# Patient Record
Sex: Female | Born: 1955
Health system: Southern US, Community
[De-identification: ages and names within clinical notes are randomized; demographics above are authoritative.]

## PROBLEM LIST (undated history)

## (undated) DIAGNOSIS — N943 Premenstrual tension syndrome: Secondary | ICD-10-CM

## (undated) HISTORY — DX: Premenstrual tension syndrome: N94.3

## (undated) HISTORY — PX: APPENDECTOMY: SHX54

---

## 2000-03-26 ENCOUNTER — Other Ambulatory Visit: Admission: RE | Admit: 2000-03-26 | Discharge: 2000-03-26 | Payer: Self-pay | Admitting: Family Medicine

## 2001-07-30 ENCOUNTER — Encounter: Payer: Self-pay | Admitting: Family Medicine

## 2001-07-30 ENCOUNTER — Encounter: Admission: RE | Admit: 2001-07-30 | Discharge: 2001-07-30 | Payer: Self-pay | Admitting: Family Medicine

## 2001-08-15 ENCOUNTER — Other Ambulatory Visit: Admission: RE | Admit: 2001-08-15 | Discharge: 2001-08-15 | Payer: Self-pay | Admitting: Family Medicine

## 2002-09-22 ENCOUNTER — Other Ambulatory Visit: Admission: RE | Admit: 2002-09-22 | Discharge: 2002-09-22 | Payer: Self-pay | Admitting: Family Medicine

## 2003-09-24 ENCOUNTER — Ambulatory Visit (HOSPITAL_COMMUNITY): Admission: RE | Admit: 2003-09-24 | Discharge: 2003-09-24 | Payer: Self-pay | Admitting: Family Medicine

## 2003-09-29 ENCOUNTER — Other Ambulatory Visit: Admission: RE | Admit: 2003-09-29 | Discharge: 2003-09-29 | Payer: Self-pay | Admitting: Family Medicine

## 2004-06-13 ENCOUNTER — Ambulatory Visit: Payer: Self-pay | Admitting: Family Medicine

## 2004-06-16 ENCOUNTER — Ambulatory Visit: Payer: Self-pay | Admitting: Family Medicine

## 2004-07-01 ENCOUNTER — Ambulatory Visit: Payer: Self-pay | Admitting: Family Medicine

## 2004-09-01 ENCOUNTER — Ambulatory Visit: Payer: Self-pay | Admitting: Family Medicine

## 2004-09-08 ENCOUNTER — Other Ambulatory Visit: Admission: RE | Admit: 2004-09-08 | Discharge: 2004-09-08 | Payer: Self-pay | Admitting: Family Medicine

## 2004-09-08 ENCOUNTER — Ambulatory Visit: Payer: Self-pay | Admitting: Family Medicine

## 2005-09-16 ENCOUNTER — Emergency Department (HOSPITAL_COMMUNITY): Admission: EM | Admit: 2005-09-16 | Discharge: 2005-09-16 | Payer: Self-pay | Admitting: Emergency Medicine

## 2005-09-18 ENCOUNTER — Ambulatory Visit: Payer: Self-pay | Admitting: Family Medicine

## 2005-11-28 ENCOUNTER — Ambulatory Visit (HOSPITAL_COMMUNITY): Admission: RE | Admit: 2005-11-28 | Discharge: 2005-11-28 | Payer: Self-pay | Admitting: Family Medicine

## 2005-12-07 ENCOUNTER — Ambulatory Visit: Payer: Self-pay | Admitting: Family Medicine

## 2005-12-14 ENCOUNTER — Ambulatory Visit: Payer: Self-pay | Admitting: Family Medicine

## 2005-12-14 ENCOUNTER — Other Ambulatory Visit: Admission: RE | Admit: 2005-12-14 | Discharge: 2005-12-14 | Payer: Self-pay | Admitting: Family Medicine

## 2005-12-14 ENCOUNTER — Encounter: Payer: Self-pay | Admitting: Family Medicine

## 2006-01-18 DIAGNOSIS — M25529 Pain in unspecified elbow: Secondary | ICD-10-CM | POA: Insufficient documentation

## 2007-01-24 ENCOUNTER — Ambulatory Visit (HOSPITAL_COMMUNITY): Admission: RE | Admit: 2007-01-24 | Discharge: 2007-01-24 | Payer: Self-pay | Admitting: Family Medicine

## 2007-02-14 ENCOUNTER — Ambulatory Visit: Payer: Self-pay | Admitting: Family Medicine

## 2007-03-15 ENCOUNTER — Ambulatory Visit: Payer: Self-pay | Admitting: Family Medicine

## 2007-03-15 LAB — CONVERTED CEMR LAB
ALT: 17 units/L (ref 0–35)
Alkaline Phosphatase: 69 units/L (ref 39–117)
BUN: 7 mg/dL (ref 6–23)
Basophils Relative: 0 % (ref 0.0–1.0)
CO2: 25 meq/L (ref 19–32)
Calcium: 8.8 mg/dL (ref 8.4–10.5)
Chloride: 109 meq/L (ref 96–112)
Creatinine, Ser: 0.8 mg/dL (ref 0.4–1.2)
HDL: 38.8 mg/dL — ABNORMAL LOW (ref >39.0)
Ketones, urine, test strip: NEGATIVE
LDL Cholesterol: 103 mg/dL — ABNORMAL HIGH (ref 0–99)
Monocytes Relative: 7.3 % (ref 3.0–11.0)
Nitrite: NEGATIVE
Platelets: 203 10*3/uL (ref 150–400)
RDW: 13.3 % (ref 11.5–14.6)
Total Bilirubin: 0.6 mg/dL (ref 0.3–1.2)
Total Protein: 6.4 g/dL (ref 6.0–8.3)
Triglycerides: 90 mg/dL (ref 0–149)
Urobilinogen, UA: 0.2
VLDL: 18 mg/dL (ref 0–40)

## 2007-03-22 ENCOUNTER — Other Ambulatory Visit: Admission: RE | Admit: 2007-03-22 | Discharge: 2007-03-22 | Payer: Self-pay | Admitting: Family Medicine

## 2007-03-22 ENCOUNTER — Encounter: Payer: Self-pay | Admitting: Family Medicine

## 2007-03-22 ENCOUNTER — Ambulatory Visit: Payer: Self-pay | Admitting: Family Medicine

## 2007-03-22 DIAGNOSIS — N959 Unspecified menopausal and perimenopausal disorder: Secondary | ICD-10-CM | POA: Insufficient documentation

## 2007-04-11 ENCOUNTER — Telehealth (INDEPENDENT_AMBULATORY_CARE_PROVIDER_SITE_OTHER): Payer: Self-pay | Admitting: *Deleted

## 2008-04-15 ENCOUNTER — Ambulatory Visit (HOSPITAL_COMMUNITY): Admission: RE | Admit: 2008-04-15 | Discharge: 2008-04-15 | Payer: Self-pay | Admitting: Family Medicine

## 2008-05-21 ENCOUNTER — Ambulatory Visit: Payer: Self-pay | Admitting: Family Medicine

## 2008-05-21 LAB — CONVERTED CEMR LAB
ALT: 20 units/L (ref 0–35)
AST: 20 units/L (ref 0–37)
Alkaline Phosphatase: 81 units/L (ref 39–117)
Basophils Absolute: 0 10*3/uL (ref 0.0–0.1)
Basophils Relative: 0.5 % (ref 0.0–3.0)
Bilirubin Urine: NEGATIVE
Bilirubin, Direct: 0.1 mg/dL (ref 0.0–0.3)
CO2: 28 meq/L (ref 19–32)
Chloride: 108 meq/L (ref 96–112)
Cholesterol: 188 mg/dL (ref 0–200)
Creatinine, Ser: 0.9 mg/dL (ref 0.4–1.2)
LDL Cholesterol: 124 mg/dL — ABNORMAL HIGH (ref 0–99)
Lymphocytes Relative: 40.8 % (ref 12.0–46.0)
MCHC: 33.6 g/dL (ref 30.0–36.0)
Neutrophils Relative %: 46.6 % (ref 43.0–77.0)
Platelets: 203 10*3/uL (ref 150–400)
Potassium: 4.5 meq/L (ref 3.5–5.1)
Protein, U semiquant: NEGATIVE
RBC: 4.84 M/uL (ref 3.87–5.11)
Sodium: 145 meq/L (ref 135–145)
Total Bilirubin: 0.9 mg/dL (ref 0.3–1.2)
Urobilinogen, UA: 0.2
VLDL: 21 mg/dL (ref 0–40)
WBC Urine, dipstick: NEGATIVE

## 2008-06-19 ENCOUNTER — Other Ambulatory Visit: Admission: RE | Admit: 2008-06-19 | Discharge: 2008-06-19 | Payer: Self-pay | Admitting: Family Medicine

## 2008-06-19 ENCOUNTER — Encounter: Payer: Self-pay | Admitting: Family Medicine

## 2008-06-19 ENCOUNTER — Ambulatory Visit: Payer: Self-pay | Admitting: Family Medicine

## 2009-05-13 ENCOUNTER — Ambulatory Visit (HOSPITAL_COMMUNITY): Admission: RE | Admit: 2009-05-13 | Discharge: 2009-05-13 | Payer: Self-pay | Admitting: Family Medicine

## 2009-05-13 LAB — HM MAMMOGRAPHY

## 2009-10-05 ENCOUNTER — Ambulatory Visit: Payer: Self-pay | Admitting: Family Medicine

## 2009-10-05 LAB — CONVERTED CEMR LAB
ALT: 15 units/L (ref 0–35)
AST: 16 units/L (ref 0–37)
Alkaline Phosphatase: 78 units/L (ref 39–117)
Bilirubin Urine: NEGATIVE
Calcium: 9.4 mg/dL (ref 8.4–10.5)
Eosinophils Relative: 3.7 % (ref 0.0–5.0)
GFR calc non Af Amer: 60.95 mL/min (ref >60)
HCT: 44.3 % (ref 36.0–46.0)
HDL: 48.8 mg/dL (ref >39.00)
Hemoglobin: 14.3 g/dL (ref 12.0–15.0)
Ketones, urine, test strip: NEGATIVE
LDL Cholesterol: 112 mg/dL — ABNORMAL HIGH (ref 0–99)
Lymphocytes Relative: 45 % (ref 12.0–46.0)
Lymphs Abs: 1.8 10*3/uL (ref 0.7–4.0)
Monocytes Relative: 9.1 % (ref 3.0–12.0)
Neutro Abs: 1.7 10*3/uL (ref 1.4–7.7)
Nitrite: NEGATIVE
Platelets: 200 10*3/uL (ref 150.0–400.0)
Potassium: 5 meq/L (ref 3.5–5.1)
Protein, U semiquant: NEGATIVE
Sodium: 144 meq/L (ref 135–145)
TSH: 2.02 microintl units/mL (ref 0.35–5.50)
Total Bilirubin: 0.7 mg/dL (ref 0.3–1.2)
Urobilinogen, UA: 0.2
VLDL: 27.8 mg/dL (ref 0.0–40.0)
WBC: 4.1 10*3/uL — ABNORMAL LOW (ref 4.5–10.5)

## 2009-10-14 ENCOUNTER — Other Ambulatory Visit: Admission: RE | Admit: 2009-10-14 | Discharge: 2009-10-14 | Payer: Self-pay | Admitting: Family Medicine

## 2009-10-14 ENCOUNTER — Ambulatory Visit: Payer: Self-pay | Admitting: Family Medicine

## 2010-01-28 ENCOUNTER — Encounter: Payer: Self-pay | Admitting: Family Medicine

## 2010-05-13 ENCOUNTER — Telehealth: Payer: Self-pay | Admitting: Family Medicine

## 2010-08-16 NOTE — Progress Notes (Signed)
Summary: REQUEST  Phone Note Call from Patient   Caller: Patient Summary of Call: Pt called in to see if Dr Tawanna Cooler would agree to accept her mother as a new pt  Sundra Aland, DOB: 6.15.1927..... Insurance: Medicare).... Pt can be reached at [phone] to advise.  Initial call taken by: Debbra Riding,  May 13, 2010 11:54 AM  Follow-up for Phone Call        yes Follow-up by: Roderick Pee MD,  May 13, 2010 2:02 PM  Additional Follow-up for Phone Call Additional follow up Details #1::        Contacted pt and adv her that Dr Tawanna Cooler will accept her mom as a new medicare pt.... Scheduled new pt appt on  07/21/2010  @  9:30am.  Additional Follow-up by: Debbra Riding,  May 13, 2010 2:52 PM

## 2010-08-16 NOTE — Letter (Signed)
Summary: [facility] Referral (unable to schedule) Notification  [facility]  [address] [place], [place] [address]   Phone: [phone]  Fax: [phone]      January 28, 2010 Tracy Burns May 30, 1956 MRN: [id number]   [facility] [address] [place], [place]  [address]   Dear Dr.TODD:   Thank you for your kind referral of the above patient. We have attempted to schedule the recommended COLONOSCOPY but have been unable to schedule because:  _X_ The patient was not available by phone and/or has not returned our calls.  __ The patient declined to schedule the procedure at this time.  We appreciate the referral and hope that we will have the opportunity to treat this patient in the future.    Sincerely,   [facility]  Vania Rea. Jarold Motto M.D. Hedwig Morton. Juanda Chance M.D. Venita Lick. Russella Dar M.D. Wilhemina Bonito. Marina Goodell M.D. Barbette Hair. Arlyce Dice M.D. Iva Boop M.D. Cheron Every.D.

## 2010-08-16 NOTE — Assessment & Plan Note (Signed)
Summary: cpx--pap//ccm   Vital Signs:  Patient profile:   55 year old female Menstrual status:  hysterectomy Height:      65.5 inches Weight:      183 pounds BMI:     30.10 Temp:     98.0 degrees F oral BP sitting:   118 / 78  (left arm) Cuff size:   regular  Vitals Entered By: Kern Reap CMA Duncan Dull) (October 14, 2009 3:59 PM) CC: cpx Is Patient Diabetic? No Pain Assessment Patient in pain? no          Menstrual Status hysterectomy Last PAP Result NEGATIVE FOR INTRAEPITHELIAL LESIONS OR MALIGNANCY.   CC:  cpx.  History of Present Illness: Tracy Burns is a 55 year old single female, 5 years, plus postmenopausal, who comes in today for physical examination  She's always been in excellent, health.  She's had no chronic health problems.  She takes 181 mg baby aspirin daily and uses Premarin vaginal cream twice weekly for vaginal dryness.  She gets routine eye care.  Dental care does BSE monthly and gets any mammography.  Tetanus 2003 seasonal flu 2010.  She's never had a colonoscopy.  We have recommended in the past, but she is not followed through.  This year.  She agrees to have a colonoscopy  Allergies: No Known Drug Allergies  Past History:  Past medical, surgical, family and social histories (including risk factors) reviewed, and no changes noted (except as noted below).  Past Medical History: Reviewed history from 03/19/2007 and no changes required. PMS  Past Surgical History: Reviewed history from 03/19/2007 and no changes required. Appendectomy  Family History: Reviewed history from 03/19/2007 and no changes required. Family History Hypertension Family History of Cardiovascular disorder  Social History: Reviewed history from 06/19/2008 and no changes required. 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 No apparent alopecia or balding. Eyes:  No corneal or conjunctival inflammation noted. EOMI. Perrla. Funduscopic exam benign, without hemorrhages, exudates or papilledema. Vision grossly normal. Ears:  External ear exam shows no significant lesions or deformities.  Otoscopic examination reveals clear canals, tympanic membranes are intact bilaterally without bulging, retraction, inflammation or discharge. Hearing is grossly normal bilaterally. Nose:  External nasal examination shows no deformity or inflammation. Nasal mucosa are pink and moist without lesions or exudates. Mouth:  Oral mucosa and oropharynx without lesions or exudates.  Teeth in good repair. Neck:  No deformities, masses, or tenderness noted. Chest Wall:  No deformities, masses, or tenderness noted. Breasts:  No mass, nodules, thickening, tenderness, bulging, retraction, inflamation, nipple discharge or skin changes noted.   Lungs:  Normal respiratory effort, chest expands symmetrically. Lungs are clear to auscultation, no crackles or wheezes. Heart:  Normal rate and regular rhythm. S1 and S2 normal without gallop, murmur, click, rub or other extra sounds. Abdomen:  Bowel sounds positive,abdomen soft and non-tender without masses, organomegaly or hernias noted. Rectal:  No external abnormalities noted. Normal sphincter tone. No rectal masses or tenderness. Genitalia:  Pelvic Exam:        External: normal female genitalia without lesions or masses        Vagina: normal without lesions or masses        Cervix: normal without lesions or masses  Adnexa: normal bimanual exam without masses or fullness        Uterus: normal by palpation        Pap smear: performed Msk:  No deformity or scoliosis noted of thoracic or lumbar spine.   Pulses:  R and L  carotid,radial,femoral,dorsalis pedis and posterior tibial pulses are full and equal bilaterally Extremities:  No clubbing, cyanosis, edema, or deformity noted with normal full range of motion of all joints.   Neurologic:  No cranial nerve deficits noted. Station and gait are normal. Plantar reflexes are down-going bilaterally. DTRs are symmetrical throughout. Sensory, motor and coordinative functions appear intact. Skin:  Intact without suspicious lesions or rashes Cervical Nodes:  No lymphadenopathy noted Axillary Nodes:  No palpable lymphadenopathy Inguinal Nodes:  No significant adenopathy Psych:  Cognition and judgment appear intact. Alert and cooperative with normal attention span and concentration. No apparent delusions, illusions, hallucinations   Impression & Recommendations:  Problem # 1:  DISORDER, MENOPAUSAL NOS (ICD-627.9) Assessment Improved  Her updated medication list for this problem includes:    Premarin 0.625 Mg/gm Crea (Estrogens, conjugated) .Marland Kitchen... Apply 2 x week  Orders: Prescription Created Electronically [phone])  Problem # 2:  HEALTH SCREENING (ICD-V70.0) Assessment: Unchanged  Orders: Prescription Created Electronically [id number]) EKG w/ Interpretation (93000)  Complete Medication List: 1)  Adult Aspirin Low Strength 81 Mg Tbdp (Aspirin) .... Once daily 2)  Premarin 0.625 Mg/gm Crea (Estrogens, conjugated) .... Apply 2 x week  Other Orders: Gastroenterology Referral (GI)  Patient Instructions: 1)  Please schedule a follow-up appointment in 1 year. 2)  It is important that you exercise regularly at least 20 minutes 5 times a week. If you develop chest pain, have severe difficulty breathing, or feel very tired , stop exercising immediately and seek medical attention. 3)  Schedule your mammogram. 4)  Schedule a colonoscopy/sigmoidoscopy to help detect colon cancer. 5)  Take calcium +Vitamin D daily. 6)  Take an Aspirin every day. Prescriptions: PREMARIN  0.625 MG/GM CREA (ESTROGENS, CONJUGATED) apply 2 x week  #3 tubes x 6   Entered and Authorized by:   Roderick Pee MD   Signed by:   Roderick Pee MD on 10/14/2009   Method used:   Electronically to        [facility]  [phone]* (retail)       [address] [place], [place]  [address]       Ph: [phone] or [phone]       Fax: [phone]   RxID:   [id number]    Immunization History:  Influenza Immunization History:    Influenza:  historical (04/16/2009)

## 2010-08-19 ENCOUNTER — Other Ambulatory Visit: Payer: Self-pay | Admitting: *Deleted

## 2010-08-19 DIAGNOSIS — N951 Menopausal and female climacteric states: Secondary | ICD-10-CM

## 2010-08-19 MED ORDER — ESTROGENS, CONJUGATED 0.625 MG/GM VA CREA: TOPICAL_CREAM | Freq: Every day | VAGINAL | Status: DC

## 2010-10-10 ENCOUNTER — Other Ambulatory Visit: Payer: Self-pay | Admitting: Family Medicine

## 2010-10-10 ENCOUNTER — Other Ambulatory Visit (INDEPENDENT_AMBULATORY_CARE_PROVIDER_SITE_OTHER): Payer: BC Managed Care – PPO | Admitting: Family Medicine

## 2010-10-10 DIAGNOSIS — E785 Hyperlipidemia, unspecified: Secondary | ICD-10-CM

## 2010-10-10 DIAGNOSIS — Z1231 Encounter for screening mammogram for malignant neoplasm of breast: Secondary | ICD-10-CM

## 2010-10-10 DIAGNOSIS — Z0000 Encounter for general adult medical examination without abnormal findings: Secondary | ICD-10-CM

## 2010-10-10 LAB — CBC WITH DIFFERENTIAL/PLATELET
Basophils Absolute: 0 10*3/uL (ref 0.0–0.1)
HCT: 44.9 % (ref 36.0–46.0)
Hemoglobin: 15.2 g/dL — ABNORMAL HIGH (ref 12.0–15.0)
Lymphs Abs: 2.2 10*3/uL (ref 0.7–4.0)
MCHC: 33.8 g/dL (ref 30.0–36.0)
MCV: 87.7 fl (ref 78.0–100.0)
Monocytes Absolute: 0.4 10*3/uL (ref 0.1–1.0)
Monocytes Relative: 8.8 % (ref 3.0–12.0)
Neutro Abs: 2.2 10*3/uL (ref 1.4–7.7)
Platelets: 219 10*3/uL (ref 150.0–400.0)
RDW: 14.5 % (ref 11.5–14.6)

## 2010-10-10 LAB — LIPID PANEL
Cholesterol: 218 mg/dL — ABNORMAL HIGH (ref 0–200)
Total CHOL/HDL Ratio: 4
Triglycerides: 118 mg/dL (ref 0.0–149.0)
VLDL: 23.6 mg/dL (ref 0.0–40.0)

## 2010-10-10 LAB — HEPATIC FUNCTION PANEL
AST: 18 U/L (ref 0–37)
Albumin: 4.3 g/dL (ref 3.5–5.2)
Total Bilirubin: 0.4 mg/dL (ref 0.3–1.2)

## 2010-10-10 LAB — BASIC METABOLIC PANEL
BUN: 14 mg/dL (ref 6–23)
CO2: 26 mEq/L (ref 19–32)
Chloride: 106 mEq/L (ref 96–112)
GFR: 60.73 mL/min (ref >60.00)
Glucose, Bld: 98 mg/dL (ref 70–99)
Potassium: 5.4 mEq/L — ABNORMAL HIGH (ref 3.5–5.1)
Sodium: 140 mEq/L (ref 135–145)

## 2010-10-10 LAB — POCT URINALYSIS DIPSTICK
Bilirubin, UA: NEGATIVE
Blood, UA: NEGATIVE
Glucose, UA: NEGATIVE
Spec Grav, UA: 1.01
Urobilinogen, UA: 0.2
pH, UA: 5

## 2010-10-10 LAB — LDL CHOLESTEROL, DIRECT: Direct LDL: 155 mg/dL

## 2010-10-14 ENCOUNTER — Encounter: Payer: Self-pay | Admitting: Family Medicine

## 2010-10-14 ENCOUNTER — Ambulatory Visit (HOSPITAL_COMMUNITY)
Admission: RE | Admit: 2010-10-14 | Discharge: 2010-10-14 | Disposition: A | Discharge status: Home / Self Care | Payer: BC Managed Care – PPO | Source: Ambulatory Visit | Attending: Family Medicine | Admitting: Family Medicine

## 2010-10-14 DIAGNOSIS — Z1231 Encounter for screening mammogram for malignant neoplasm of breast: Secondary | ICD-10-CM

## 2010-10-17 ENCOUNTER — Encounter: Payer: Self-pay | Admitting: Family Medicine

## 2010-10-17 ENCOUNTER — Other Ambulatory Visit (HOSPITAL_COMMUNITY)
Admission: RE | Admit: 2010-10-17 | Discharge: 2010-10-17 | Disposition: A | Discharge status: Home / Self Care | Payer: BC Managed Care – PPO | Source: Ambulatory Visit | Attending: Family Medicine | Admitting: Family Medicine

## 2010-10-17 ENCOUNTER — Ambulatory Visit (INDEPENDENT_AMBULATORY_CARE_PROVIDER_SITE_OTHER): Payer: BC Managed Care – PPO | Admitting: Family Medicine

## 2010-10-17 DIAGNOSIS — N959 Unspecified menopausal and perimenopausal disorder: Secondary | ICD-10-CM

## 2010-10-17 DIAGNOSIS — Z136 Encounter for screening for cardiovascular disorders: Secondary | ICD-10-CM

## 2010-10-17 DIAGNOSIS — N951 Menopausal and female climacteric states: Secondary | ICD-10-CM

## 2010-10-17 DIAGNOSIS — Z01419 Encounter for gynecological examination (general) (routine) without abnormal findings: Secondary | ICD-10-CM | POA: Insufficient documentation

## 2010-10-17 DIAGNOSIS — Z0000 Encounter for general adult medical examination without abnormal findings: Secondary | ICD-10-CM

## 2010-10-17 MED ORDER — ESTROGENS, CONJUGATED 0.625 MG/GM VA CREA: TOPICAL_CREAM | VAGINAL | Status: DC

## 2010-10-17 NOTE — Progress Notes (Signed)
  Subjective:    Patient ID: Tracy Burns, female    DOB: [birth year], 55 y.o.   MRN: [id number]  HPIMichelle is a delightful, 55 year old, single female, nonsmoker comes in today for general physical examination  She's always been in excellent, health.  She's had no chronic health problems.  She is postmenopausal and uses Premarin vaginal cream weekly because of severe vaginal dryness.  She gets routine eye care, dental care, BSE monthly, and a mammography,, never had a colonoscopy,,,,,,,,,,,, she agrees to have it done now,,,,,,,,, tetanus 2003.  She continues to work full time.  Her mother recently moved in with her.  Tracy Burns is an only child    Review of Systems  Constitutional: Negative.   HENT: Negative.   Eyes: Negative.   Respiratory: Negative.   Cardiovascular: Negative.   Gastrointestinal: Negative.   Genitourinary: Negative.   Musculoskeletal: Negative.   Neurological: Negative.   Hematological: Negative.   Psychiatric/Behavioral: Negative.        Objective:   Physical Exam  Constitutional: She appears well-developed and well-nourished.  HENT:  Head: Normocephalic and atraumatic.  Right Ear: External ear normal.  Left Ear: External ear normal.  Nose: Nose normal.  Mouth/Throat: Oropharynx is clear and moist.  Eyes: EOM are normal. Pupils are equal, round, and reactive to light.  Neck: Normal range of motion. Neck supple. No thyromegaly present.  Cardiovascular: Normal rate, regular rhythm, normal heart sounds and intact distal pulses.  Exam reveals no gallop and no friction rub.   No murmur heard. Pulmonary/Chest: Effort normal and breath sounds normal.  Abdominal: Soft. Bowel sounds are normal. She exhibits no distension and no mass. There is no tenderness. There is no rebound.  Genitourinary: Vagina normal and uterus normal. Guaiac negative stool. No vaginal discharge found.       Bilateral breast exam no  Musculoskeletal: Normal range of motion.    Lymphadenopathy:    She has no cervical adenopathy.  Neurological: She is alert. She has normal reflexes. No cranial nerve deficit. She exhibits normal muscle tone. Coordination normal.  Skin: Skin is warm and dry.  Psychiatric: She has a normal mood and affect. Her behavior is normal. Judgment and thought content normal.          Assessment & Plan:  Healthy female.  Postmenopausal vaginal dryness.  Continue Premarin vaginal cream.  Set up for screening mammogram.  Set up for screening colonoscopy

## 2010-10-17 NOTE — Patient Instructions (Signed)
Continued good health habits.  Remember to walk 15 minutes daily take calcium and vitamin D.  Follow-up in one year.  Use the Premarin cream twice weekly.  We will also get you set up for a screening colonoscopy

## 2010-12-23 ENCOUNTER — Encounter: Payer: Self-pay | Admitting: Internal Medicine

## 2011-05-25 ENCOUNTER — Other Ambulatory Visit: Payer: Self-pay | Admitting: Family Medicine

## 2011-11-24 ENCOUNTER — Other Ambulatory Visit: Payer: Self-pay | Admitting: Family Medicine

## 2011-11-24 DIAGNOSIS — Z1231 Encounter for screening mammogram for malignant neoplasm of breast: Secondary | ICD-10-CM

## 2011-12-25 ENCOUNTER — Ambulatory Visit (HOSPITAL_COMMUNITY)
Admission: RE | Admit: 2011-12-25 | Discharge: 2011-12-25 | Disposition: A | Discharge status: Home / Self Care | Payer: BC Managed Care – PPO | Source: Ambulatory Visit | Attending: Family Medicine | Admitting: Family Medicine

## 2011-12-25 DIAGNOSIS — Z1231 Encounter for screening mammogram for malignant neoplasm of breast: Secondary | ICD-10-CM

## 2013-09-18 ENCOUNTER — Encounter: Payer: Self-pay | Admitting: Family Medicine

## 2013-09-18 ENCOUNTER — Ambulatory Visit (INDEPENDENT_AMBULATORY_CARE_PROVIDER_SITE_OTHER): Payer: BC Managed Care – PPO | Admitting: Family Medicine

## 2013-09-18 VITALS — BP 120/80 | Temp 99.1°F | Wt 198.0 lb

## 2013-09-18 DIAGNOSIS — R300 Dysuria: Secondary | ICD-10-CM

## 2013-09-18 DIAGNOSIS — N3090 Cystitis, unspecified without hematuria: Secondary | ICD-10-CM

## 2013-09-18 LAB — POCT URINALYSIS DIPSTICK
BILIRUBIN UA: NEGATIVE
Glucose, UA: NEGATIVE
KETONES UA: NEGATIVE
Leukocytes, UA: NEGATIVE
NITRITE UA: NEGATIVE
PH UA: 6.5
Protein, UA: NEGATIVE
RBC UA: NEGATIVE
Urobilinogen, UA: 0.2

## 2013-09-18 MED ORDER — SULFAMETHOXAZOLE-TMP DS 800-160 MG PO TABS: 1.0000 | ORAL_TABLET | Freq: Two times a day (BID) | ORAL | Status: DC

## 2013-09-18 NOTE — Progress Notes (Signed)
Pre visit review using our clinic review tool, if applicable. No additional management support is needed unless otherwise documented below in the visit note. 

## 2013-09-18 NOTE — Progress Notes (Signed)
   Subjective:    Patient ID: Tracy Burns, female    DOB: 09/18/1955, 58 y.o.   MRN: [id number]  HPI Tracy Burns is a 58-year-old married female nonsmoker who comes in today for evaluation of her urinary tract infection  She said on Monday she began having dysuria yesterday she began having frequency. No fever chills or back pain  Her urine today looks fairly clear. However she states in the past she's had repeated kidney infections and would like something to take    Review of Systems    review of systems otherwise negative Objective:   Physical Exam Well-developed well-nourished female no acute distress vital signs stable she is afebrile urinalysis normal       Assessment & Plan:  Cystitis probably resolving spontaneously with drinking lots of water cover with Septra per patient request

## 2013-09-18 NOTE — Patient Instructions (Signed)
Drink lots of water  Septra DS,,,,,,,,, one twice daily till bilateral empty

## 2015-12-17 DIAGNOSIS — J3081 Allergic rhinitis due to animal (cat) (dog) hair and dander: Secondary | ICD-10-CM | POA: Diagnosis not present

## 2016-01-20 DIAGNOSIS — J3089 Other allergic rhinitis: Secondary | ICD-10-CM | POA: Diagnosis not present

## 2016-01-20 DIAGNOSIS — J301 Allergic rhinitis due to pollen: Secondary | ICD-10-CM | POA: Diagnosis not present

## 2016-01-20 DIAGNOSIS — J3081 Allergic rhinitis due to animal (cat) (dog) hair and dander: Secondary | ICD-10-CM | POA: Diagnosis not present

## 2016-02-29 DIAGNOSIS — J3089 Other allergic rhinitis: Secondary | ICD-10-CM | POA: Diagnosis not present

## 2016-02-29 DIAGNOSIS — J301 Allergic rhinitis due to pollen: Secondary | ICD-10-CM | POA: Diagnosis not present

## 2016-02-29 DIAGNOSIS — J3081 Allergic rhinitis due to animal (cat) (dog) hair and dander: Secondary | ICD-10-CM | POA: Diagnosis not present

## 2016-03-29 DIAGNOSIS — J3081 Allergic rhinitis due to animal (cat) (dog) hair and dander: Secondary | ICD-10-CM | POA: Diagnosis not present

## 2016-03-29 DIAGNOSIS — J301 Allergic rhinitis due to pollen: Secondary | ICD-10-CM | POA: Diagnosis not present

## 2016-03-29 DIAGNOSIS — J3089 Other allergic rhinitis: Secondary | ICD-10-CM | POA: Diagnosis not present

## 2016-05-05 DIAGNOSIS — J3081 Allergic rhinitis due to animal (cat) (dog) hair and dander: Secondary | ICD-10-CM | POA: Diagnosis not present

## 2016-05-05 DIAGNOSIS — J301 Allergic rhinitis due to pollen: Secondary | ICD-10-CM | POA: Diagnosis not present

## 2016-05-05 DIAGNOSIS — J3089 Other allergic rhinitis: Secondary | ICD-10-CM | POA: Diagnosis not present

## 2016-05-05 DIAGNOSIS — H1045 Other chronic allergic conjunctivitis: Secondary | ICD-10-CM | POA: Diagnosis not present

## 2016-06-06 DIAGNOSIS — J3089 Other allergic rhinitis: Secondary | ICD-10-CM | POA: Diagnosis not present

## 2016-06-06 DIAGNOSIS — J3081 Allergic rhinitis due to animal (cat) (dog) hair and dander: Secondary | ICD-10-CM | POA: Diagnosis not present

## 2016-06-06 DIAGNOSIS — J301 Allergic rhinitis due to pollen: Secondary | ICD-10-CM | POA: Diagnosis not present

## 2016-06-13 DIAGNOSIS — J3089 Other allergic rhinitis: Secondary | ICD-10-CM | POA: Diagnosis not present

## 2016-06-13 DIAGNOSIS — J3081 Allergic rhinitis due to animal (cat) (dog) hair and dander: Secondary | ICD-10-CM | POA: Diagnosis not present

## 2016-06-13 DIAGNOSIS — J301 Allergic rhinitis due to pollen: Secondary | ICD-10-CM | POA: Diagnosis not present

## 2016-07-24 DIAGNOSIS — J301 Allergic rhinitis due to pollen: Secondary | ICD-10-CM | POA: Diagnosis not present

## 2016-07-24 DIAGNOSIS — J3089 Other allergic rhinitis: Secondary | ICD-10-CM | POA: Diagnosis not present

## 2016-07-24 DIAGNOSIS — J3081 Allergic rhinitis due to animal (cat) (dog) hair and dander: Secondary | ICD-10-CM | POA: Diagnosis not present

## 2016-08-01 DIAGNOSIS — J3081 Allergic rhinitis due to animal (cat) (dog) hair and dander: Secondary | ICD-10-CM | POA: Diagnosis not present

## 2016-08-01 DIAGNOSIS — J301 Allergic rhinitis due to pollen: Secondary | ICD-10-CM | POA: Diagnosis not present

## 2016-08-01 DIAGNOSIS — J3089 Other allergic rhinitis: Secondary | ICD-10-CM | POA: Diagnosis not present

## 2016-08-04 DIAGNOSIS — J3089 Other allergic rhinitis: Secondary | ICD-10-CM | POA: Diagnosis not present

## 2016-08-04 DIAGNOSIS — J301 Allergic rhinitis due to pollen: Secondary | ICD-10-CM | POA: Diagnosis not present

## 2016-08-04 DIAGNOSIS — J3081 Allergic rhinitis due to animal (cat) (dog) hair and dander: Secondary | ICD-10-CM | POA: Diagnosis not present

## 2016-08-09 DIAGNOSIS — J3081 Allergic rhinitis due to animal (cat) (dog) hair and dander: Secondary | ICD-10-CM | POA: Diagnosis not present

## 2016-08-09 DIAGNOSIS — J3089 Other allergic rhinitis: Secondary | ICD-10-CM | POA: Diagnosis not present

## 2016-08-09 DIAGNOSIS — J301 Allergic rhinitis due to pollen: Secondary | ICD-10-CM | POA: Diagnosis not present

## 2016-08-15 DIAGNOSIS — J301 Allergic rhinitis due to pollen: Secondary | ICD-10-CM | POA: Diagnosis not present

## 2016-08-15 DIAGNOSIS — J3081 Allergic rhinitis due to animal (cat) (dog) hair and dander: Secondary | ICD-10-CM | POA: Diagnosis not present

## 2016-08-15 DIAGNOSIS — J3089 Other allergic rhinitis: Secondary | ICD-10-CM | POA: Diagnosis not present

## 2016-08-18 DIAGNOSIS — J301 Allergic rhinitis due to pollen: Secondary | ICD-10-CM | POA: Diagnosis not present

## 2016-08-18 DIAGNOSIS — J3081 Allergic rhinitis due to animal (cat) (dog) hair and dander: Secondary | ICD-10-CM | POA: Diagnosis not present

## 2016-08-18 DIAGNOSIS — J3089 Other allergic rhinitis: Secondary | ICD-10-CM | POA: Diagnosis not present

## 2016-09-26 DIAGNOSIS — J3089 Other allergic rhinitis: Secondary | ICD-10-CM | POA: Diagnosis not present

## 2016-09-26 DIAGNOSIS — J301 Allergic rhinitis due to pollen: Secondary | ICD-10-CM | POA: Diagnosis not present

## 2016-09-26 DIAGNOSIS — J3081 Allergic rhinitis due to animal (cat) (dog) hair and dander: Secondary | ICD-10-CM | POA: Diagnosis not present

## 2016-09-28 DIAGNOSIS — J3081 Allergic rhinitis due to animal (cat) (dog) hair and dander: Secondary | ICD-10-CM | POA: Diagnosis not present

## 2016-11-02 DIAGNOSIS — J3089 Other allergic rhinitis: Secondary | ICD-10-CM | POA: Diagnosis not present

## 2016-11-02 DIAGNOSIS — J301 Allergic rhinitis due to pollen: Secondary | ICD-10-CM | POA: Diagnosis not present

## 2016-11-02 DIAGNOSIS — J3081 Allergic rhinitis due to animal (cat) (dog) hair and dander: Secondary | ICD-10-CM | POA: Diagnosis not present

## 2016-11-30 DIAGNOSIS — J3089 Other allergic rhinitis: Secondary | ICD-10-CM | POA: Diagnosis not present

## 2016-11-30 DIAGNOSIS — J301 Allergic rhinitis due to pollen: Secondary | ICD-10-CM | POA: Diagnosis not present

## 2016-11-30 DIAGNOSIS — J3081 Allergic rhinitis due to animal (cat) (dog) hair and dander: Secondary | ICD-10-CM | POA: Diagnosis not present

## 2016-12-27 ENCOUNTER — Encounter: Payer: Self-pay | Admitting: Family Medicine

## 2016-12-27 ENCOUNTER — Ambulatory Visit (INDEPENDENT_AMBULATORY_CARE_PROVIDER_SITE_OTHER): Payer: BLUE CROSS/BLUE SHIELD | Admitting: Family Medicine

## 2016-12-27 VITALS — BP 120/80 | HR 83 | Resp 12 | Ht 66.0 in | Wt 197.5 lb

## 2016-12-27 DIAGNOSIS — H00014 Hordeolum externum left upper eyelid: Secondary | ICD-10-CM | POA: Diagnosis not present

## 2016-12-27 DIAGNOSIS — J309 Allergic rhinitis, unspecified: Secondary | ICD-10-CM

## 2016-12-27 DIAGNOSIS — H6982 Other specified disorders of Eustachian tube, left ear: Secondary | ICD-10-CM | POA: Diagnosis not present

## 2016-12-27 DIAGNOSIS — R590 Localized enlarged lymph nodes: Secondary | ICD-10-CM

## 2016-12-27 MED ORDER — ERYTHROMYCIN 5 MG/GM OP OINT: 1.0000 "application " | TOPICAL_OINTMENT | Freq: Every day | OPHTHALMIC | 0 refills | Status: AC

## 2016-12-27 MED ORDER — FLUTICASONE PROPIONATE 50 MCG/ACT NA SUSP: 1.0000 | Freq: Two times a day (BID) | NASAL | 0 refills | Status: DC

## 2016-12-27 NOTE — Progress Notes (Signed)
HPI:   ACUTE VISIT:  Chief Complaint  Patient presents with  . lump on left eyelid    Ms.Jalena Vanderlinden is a 61 y.o. female, who is here today complaining of "lump" on left upper eye lid. She has Hx of allergies, last month she followed with her immunologists, she was having conjunctival erythema and pruritus.Symptoma attributed to allergies, resolved with eye drops. She is not longer using medication.  2 weeks ago she noted lesion on left eye lid, which has been mildly tender for the past couple days and with a small crusty,white drainage in the morning. She denies conjunctival erythema or visual changes.   Eye Problem   The left eye is affected. This is a new problem. The current episode started 1 to 4 weeks ago. The problem has been waxing and waning. There was no injury mechanism. The pain is mild. There is no known exposure to pink eye. She wears contacts. Associated symptoms include an eye discharge and itching. Pertinent negatives include no blurred vision, double vision, eye redness, fever, foreign body sensation, nausea, photophobia, recent URI or vomiting. She has tried nothing for the symptoms.   2 days of left ear discomfort, "swimming" sensation. She denies earache or drainage. No fever,chills, or recent travel. No sick contact. Hx of allergic rhinitis.  Review of Systems  Constitutional: Negative for appetite change, chills, fatigue and fever.  HENT: Positive for congestion, postnasal drip and rhinorrhea. Negative for hearing loss, mouth sores, nosebleeds, sinus pain, sore throat and voice change.   Eyes: Positive for discharge and itching. Negative for blurred vision, double vision, photophobia, pain, redness and visual disturbance.  Respiratory: Negative for cough, shortness of breath and wheezing.   Gastrointestinal: Negative for nausea and vomiting.  Musculoskeletal: Negative for arthralgias and myalgias.  Skin: Negative for rash.  Allergic/Immunologic:  Positive for environmental allergies.  Neurological: Negative for dizziness and headaches.  Hematological: Negative for adenopathy. Does not bruise/bleed easily.  Psychiatric/Behavioral: Negative for confusion. The patient is not nervous/anxious.      Current Outpatient Prescriptions on File Prior to Visit  Medication Sig Dispense Refill  . aspirin 81 MG tablet Take 81 mg by mouth daily.       No current facility-administered medications on file prior to visit.      Past Medical History:  Diagnosis Date  . PMS (premenstrual syndrome)    Not on File  Social History   Social History  . Marital status: Divorced    Spouse name: N/A  . Number of children: N/A  . Years of education: N/A   Social History Main Topics  . Smoking status: Never Smoker  . Smokeless tobacco: Never Used  . Alcohol use No  . Drug use: No  . Sexual activity: Not Asked   Other Topics Concern  . None   Social History Narrative  . None    Vitals:   12/27/16 1535  BP: 120/80  Pulse: 83  Resp: 12  O2 sat at RA 95% Body mass index is 31.88 kg/m.   Physical Exam  Nursing note and vitals reviewed. Constitutional: She is oriented to person, place, and time. She appears well-developed. She does not appear ill. No distress.  HENT:  Head: Atraumatic.  Right Ear: Tympanic membrane, external ear and ear canal normal.  Left Ear: External ear and ear canal normal. Tympanic membrane is bulging (mild). A middle ear effusion is present.  Nose: Septal deviation present. Right sinus exhibits no maxillary sinus tenderness and  no frontal sinus tenderness. Left sinus exhibits no maxillary sinus tenderness and no frontal sinus tenderness.  Mouth/Throat: Oropharynx is clear and moist and mucous membranes are normal.  Eyes: Conjunctivae and EOM are normal. Pupils are equal, round, and reactive to light. Lids are everted and swept, no foreign bodies found. Left eye exhibits hordeolum.    Left upper eye lid:  Erythematous papular lesion, 2 mm, not tender with palpation. There is a punctuate clear crust, no active drainage.   Cardiovascular: Normal rate and regular rhythm.   Respiratory: Effort normal and breath sounds normal. No respiratory distress.  Lymphadenopathy:       Head (right side): No preauricular and no posterior auricular adenopathy present.       Head (left side): No preauricular and no posterior auricular adenopathy present.    She has cervical adenopathy.       Right cervical: Posterior cervical (2 cm, not tender) adenopathy present.       Left cervical: No deep cervical and no posterior cervical adenopathy present.  Neurological: She is alert and oriented to person, place, and time. She has normal strength. No cranial nerve deficit. Gait normal.  Skin: Skin is warm. No rash noted. No erythema.  Psychiatric: She has a normal mood and affect. Her speech is normal.  Well groomed, good eye contact.     ASSESSMENT AND PLAN:    Elon JesterMichele was seen today for lump on left eyelid.  Diagnoses and all orders for this visit:  Hordeolum externum left upper eyelid  Educated about Dx and treatment. Still topical abx oint recommended at bedtime x 7 days. Local heat intermittently on lesion. If not resolved in 2 weeks she needs to follow with eye care provider.  -     erythromycin [facility](ROMYCIN) ophthalmic ointment; Place 1 application into the left eye at bedtime.  Eustachian tube dysfunction, left  OTC decongestants and autoinflation maneuvers may help. Some side effects of decongestants discussed. Instructed about warning signs. F/U as needed.  Lymphadenopathy, posterior cervical  Incidental finding today, possible etiologies discussed.  I do not think further work-up is needed today but she must follow if not resolved in 4-6 weeks or if growth is noted.  Allergic rhinitis, unspecified seasonality, unspecified trigger  Start Flonase nasal spray. Nasal saline irrigations a few  times per day and as needed. OTC antihistaminics may also help.  -     fluticasone (FLONASE) 50 MCG/ACT nasal spray; Place 1 spray into both nostrils 2 (two) times daily.     -Tracy Burns was advised to seek immediate medical attention is symptoms suddenly get worse or to follow if symptoms persist or new concerns arise.      Migel Hannis G. Swaz[place]Jordan, MD  [facility]. [place] office.

## 2016-12-27 NOTE — Patient Instructions (Addendum)
  Ms.Amaani Manus RuddSchulz I have seen you today for an acute visit.  A few things to remember from today's visit:   Hordeolum externum left upper eyelid - Plan: erythromycin [facility](ROMYCIN) ophthalmic ointment  Lymphadenopathy, posterior cervical  Allergic rhinitis, unspecified seasonality, unspecified trigger - Plan: fluticasone (FLONASE) 50 MCG/ACT nasal spray      Monitor lymph node and follow if not gone in 4 weeks.  Nasal irrigation with saline and popping ears may help with sinus and ear congestion.   Medications prescribed today are intended for short period of time and will not be refill upon request, a follow up appointment might be necessary to discuss continuation of of treatment if appropriate.     In general please monitor for signs of worsening symptoms and seek immediate medical attention if any concerning.  If symptoms are not resolved in 3-4 weeks you should schedule a follow up appointment with your doctor, before if needed.  Please be sure you have an appointment already scheduled with your PCP before you leave today.

## 2016-12-29 DIAGNOSIS — J3081 Allergic rhinitis due to animal (cat) (dog) hair and dander: Secondary | ICD-10-CM | POA: Diagnosis not present

## 2016-12-29 DIAGNOSIS — J301 Allergic rhinitis due to pollen: Secondary | ICD-10-CM | POA: Diagnosis not present

## 2016-12-29 DIAGNOSIS — J3089 Other allergic rhinitis: Secondary | ICD-10-CM | POA: Diagnosis not present

## 2017-01-03 DIAGNOSIS — J3081 Allergic rhinitis due to animal (cat) (dog) hair and dander: Secondary | ICD-10-CM | POA: Diagnosis not present

## 2017-01-08 DIAGNOSIS — J3081 Allergic rhinitis due to animal (cat) (dog) hair and dander: Secondary | ICD-10-CM | POA: Diagnosis not present

## 2017-01-12 DIAGNOSIS — J3081 Allergic rhinitis due to animal (cat) (dog) hair and dander: Secondary | ICD-10-CM | POA: Diagnosis not present

## 2017-01-16 DIAGNOSIS — J3081 Allergic rhinitis due to animal (cat) (dog) hair and dander: Secondary | ICD-10-CM | POA: Diagnosis not present

## 2017-01-19 DIAGNOSIS — J3081 Allergic rhinitis due to animal (cat) (dog) hair and dander: Secondary | ICD-10-CM | POA: Diagnosis not present

## 2017-01-19 DIAGNOSIS — J301 Allergic rhinitis due to pollen: Secondary | ICD-10-CM | POA: Diagnosis not present

## 2017-01-19 DIAGNOSIS — J3089 Other allergic rhinitis: Secondary | ICD-10-CM | POA: Diagnosis not present

## 2017-01-24 DIAGNOSIS — J301 Allergic rhinitis due to pollen: Secondary | ICD-10-CM | POA: Diagnosis not present

## 2017-01-25 DIAGNOSIS — J3089 Other allergic rhinitis: Secondary | ICD-10-CM | POA: Diagnosis not present

## 2017-01-25 DIAGNOSIS — J3081 Allergic rhinitis due to animal (cat) (dog) hair and dander: Secondary | ICD-10-CM | POA: Diagnosis not present

## 2017-02-22 DIAGNOSIS — J301 Allergic rhinitis due to pollen: Secondary | ICD-10-CM | POA: Diagnosis not present

## 2017-02-22 DIAGNOSIS — J3081 Allergic rhinitis due to animal (cat) (dog) hair and dander: Secondary | ICD-10-CM | POA: Diagnosis not present

## 2017-02-22 DIAGNOSIS — J3089 Other allergic rhinitis: Secondary | ICD-10-CM | POA: Diagnosis not present

## 2017-03-27 DIAGNOSIS — J3089 Other allergic rhinitis: Secondary | ICD-10-CM | POA: Diagnosis not present

## 2017-03-27 DIAGNOSIS — J3081 Allergic rhinitis due to animal (cat) (dog) hair and dander: Secondary | ICD-10-CM | POA: Diagnosis not present

## 2017-03-27 DIAGNOSIS — J301 Allergic rhinitis due to pollen: Secondary | ICD-10-CM | POA: Diagnosis not present

## 2017-04-30 DIAGNOSIS — J301 Allergic rhinitis due to pollen: Secondary | ICD-10-CM | POA: Diagnosis not present

## 2017-04-30 DIAGNOSIS — J3089 Other allergic rhinitis: Secondary | ICD-10-CM | POA: Diagnosis not present

## 2017-04-30 DIAGNOSIS — J3081 Allergic rhinitis due to animal (cat) (dog) hair and dander: Secondary | ICD-10-CM | POA: Diagnosis not present

## 2017-05-02 DIAGNOSIS — J3089 Other allergic rhinitis: Secondary | ICD-10-CM | POA: Diagnosis not present

## 2017-05-02 DIAGNOSIS — J301 Allergic rhinitis due to pollen: Secondary | ICD-10-CM | POA: Diagnosis not present

## 2017-05-02 DIAGNOSIS — J3081 Allergic rhinitis due to animal (cat) (dog) hair and dander: Secondary | ICD-10-CM | POA: Diagnosis not present

## 2017-05-04 DIAGNOSIS — J301 Allergic rhinitis due to pollen: Secondary | ICD-10-CM | POA: Diagnosis not present

## 2017-05-04 DIAGNOSIS — J3089 Other allergic rhinitis: Secondary | ICD-10-CM | POA: Diagnosis not present

## 2017-05-04 DIAGNOSIS — H1045 Other chronic allergic conjunctivitis: Secondary | ICD-10-CM | POA: Diagnosis not present

## 2017-05-08 DIAGNOSIS — J3089 Other allergic rhinitis: Secondary | ICD-10-CM | POA: Diagnosis not present

## 2017-05-08 DIAGNOSIS — J301 Allergic rhinitis due to pollen: Secondary | ICD-10-CM | POA: Diagnosis not present

## 2017-05-08 DIAGNOSIS — J3081 Allergic rhinitis due to animal (cat) (dog) hair and dander: Secondary | ICD-10-CM | POA: Diagnosis not present

## 2017-05-14 DIAGNOSIS — J3089 Other allergic rhinitis: Secondary | ICD-10-CM | POA: Diagnosis not present

## 2017-05-14 DIAGNOSIS — J3081 Allergic rhinitis due to animal (cat) (dog) hair and dander: Secondary | ICD-10-CM | POA: Diagnosis not present

## 2017-05-14 DIAGNOSIS — J301 Allergic rhinitis due to pollen: Secondary | ICD-10-CM | POA: Diagnosis not present

## 2017-05-18 DIAGNOSIS — J3081 Allergic rhinitis due to animal (cat) (dog) hair and dander: Secondary | ICD-10-CM | POA: Diagnosis not present

## 2017-05-18 DIAGNOSIS — J301 Allergic rhinitis due to pollen: Secondary | ICD-10-CM | POA: Diagnosis not present

## 2017-05-18 DIAGNOSIS — J3089 Other allergic rhinitis: Secondary | ICD-10-CM | POA: Diagnosis not present

## 2017-05-21 DIAGNOSIS — J3089 Other allergic rhinitis: Secondary | ICD-10-CM | POA: Diagnosis not present

## 2017-05-21 DIAGNOSIS — J3081 Allergic rhinitis due to animal (cat) (dog) hair and dander: Secondary | ICD-10-CM | POA: Diagnosis not present

## 2017-05-21 DIAGNOSIS — J301 Allergic rhinitis due to pollen: Secondary | ICD-10-CM | POA: Diagnosis not present

## 2017-06-26 DIAGNOSIS — J3081 Allergic rhinitis due to animal (cat) (dog) hair and dander: Secondary | ICD-10-CM | POA: Diagnosis not present

## 2017-07-12 DIAGNOSIS — J3089 Other allergic rhinitis: Secondary | ICD-10-CM | POA: Diagnosis not present

## 2017-07-12 DIAGNOSIS — J3081 Allergic rhinitis due to animal (cat) (dog) hair and dander: Secondary | ICD-10-CM | POA: Diagnosis not present

## 2017-07-12 DIAGNOSIS — J301 Allergic rhinitis due to pollen: Secondary | ICD-10-CM | POA: Diagnosis not present

## 2017-08-28 DIAGNOSIS — J3081 Allergic rhinitis due to animal (cat) (dog) hair and dander: Secondary | ICD-10-CM | POA: Diagnosis not present

## 2017-08-28 DIAGNOSIS — J301 Allergic rhinitis due to pollen: Secondary | ICD-10-CM | POA: Diagnosis not present

## 2017-08-28 DIAGNOSIS — J3089 Other allergic rhinitis: Secondary | ICD-10-CM | POA: Diagnosis not present

## 2017-10-01 DIAGNOSIS — J3089 Other allergic rhinitis: Secondary | ICD-10-CM | POA: Diagnosis not present

## 2017-10-01 DIAGNOSIS — J301 Allergic rhinitis due to pollen: Secondary | ICD-10-CM | POA: Diagnosis not present

## 2017-10-01 DIAGNOSIS — J3081 Allergic rhinitis due to animal (cat) (dog) hair and dander: Secondary | ICD-10-CM | POA: Diagnosis not present

## 2017-10-23 ENCOUNTER — Ambulatory Visit (HOSPITAL_COMMUNITY)
Admission: EM | Admit: 2017-10-23 | Discharge: 2017-10-23 | Disposition: A | Discharge status: Home / Self Care | Payer: BLUE CROSS/BLUE SHIELD | Attending: Family Medicine | Admitting: Family Medicine

## 2017-10-23 ENCOUNTER — Encounter (HOSPITAL_COMMUNITY): Payer: Self-pay | Admitting: Emergency Medicine

## 2017-10-23 ENCOUNTER — Ambulatory Visit (INDEPENDENT_AMBULATORY_CARE_PROVIDER_SITE_OTHER): Payer: BLUE CROSS/BLUE SHIELD

## 2017-10-23 DIAGNOSIS — S6992XA Unspecified injury of left wrist, hand and finger(s), initial encounter: Secondary | ICD-10-CM | POA: Diagnosis not present

## 2017-10-23 DIAGNOSIS — M25532 Pain in left wrist: Secondary | ICD-10-CM

## 2017-10-23 NOTE — Discharge Instructions (Addendum)
Use anti-inflammatories for pain/swelling. You may take up to 800 mg Ibuprofen every 8 hours with food. You may supplement Ibuprofen with Tylenol [phone] mg every 8 hours.   Ice wrist for 15-20 , minutes daily   Use wrist brace for support as needed over next 2 weeks

## 2017-10-23 NOTE — ED Triage Notes (Signed)
Pt was walking in the woods and tripped and fell landed on her face, felt a little dizzy right after, denies LOC, c/o that her L wrist bent backward while falling.

## 2017-10-23 NOTE — ED Provider Notes (Signed)
[facility]    CSN: [id number] Arrival date & time: 10/23/17  1537     History   Chief Complaint Chief Complaint  Patient presents with  . Wrist Pain    HPI Boneta Standre is a 62 y.o. female presenting today with left wrist injury.  States that [place] and tripped over a stick and fell forward onto her wrist.  She had pain immediately and some numbness, but the numbness has resolved.  She denies any numbness or tingling at this time.  HPI  Past Medical History:  Diagnosis Date  . PMS (premenstrual syndrome)     Patient Active Problem List   Diagnosis Date Noted  . Allergic rhinitis 12/27/2016  . Cystitis 09/18/2013  . DISORDER, MENOPAUSAL NOS 03/22/2007  . PAIN IN JOINT, UPPER ARM 01/18/2006    Past Surgical History:  Procedure Laterality Date  . APPENDECTOMY      OB History   None      Home Medications    Prior to Admission medications   Medication Sig Start Date End Date Taking? Authorizing Provider  aspirin 81 MG tablet Take 81 mg by mouth daily.      [provider]  fluticasone (FLONASE) 50 MCG/ACT nasal spray Place 1 spray into both nostrils 2 (two) times daily. 12/27/16   [place], Betty G, MD    Family History Family History  Problem Relation Age of Onset  . Hypertension Other   . Hyperlipidemia Other     Social History Social History   Tobacco Use  . Smoking status: Never Smoker  . Smokeless tobacco: Never Used  Substance Use Topics  . Alcohol use: No  . Drug use: No     Allergies   Patient has no known allergies.   Review of Systems Review of Systems  Respiratory: Negative for shortness of breath.   Cardiovascular: Negative for chest pain.  Gastrointestinal: Negative for nausea and vomiting.  Musculoskeletal: Positive for arthralgias, joint swelling and myalgias. Negative for back pain and gait problem.  Skin: Negative for color change, pallor, rash and wound.    Neurological: Negative for weakness and numbness.     Physical Exam Triage Vital Signs ED Triage Vitals [10/23/17 1553]  Enc Vitals Group     BP 123/63     Pulse Rate 89     Resp 18     Temp 97.8 F (36.6 C)     Temp src      SpO2 100 %     Weight      Height      Head Circumference      Peak Flow      Pain Score 0     Pain Loc      Pain Edu?      Excl. in GC?    No data found.  Updated Vital Signs BP 123/63   Pulse 89   Temp 97.8 F (36.6 C)   Resp 18   SpO2 100%   Visual Acuity Right Eye Distance:   Left Eye Distance:   Bilateral Distance:    Right Eye Near:   Left Eye Near:    Bilateral Near:     Physical Exam  Constitutional: She appears well-developed and well-nourished. No distress.  HENT:  Head: Normocephalic and atraumatic.  Eyes: Conjunctivae are normal.  Neck: Neck supple.  Cardiovascular: Normal rate.  Pulmonary/Chest: Effort normal. No respiratory distress.  Musculoskeletal: She exhibits no edema.  No obvious deformity or swelling to left wrist.  Radial pulse 2+.  Nontender to palpation of the carpals and metacarpals 1 through 5.  Patient has full active range of motion although slightly limited due to pain.   Neurological: She is alert.  Skin: Skin is warm and dry.  Psychiatric: She has a normal mood and affect.  Nursing note and vitals reviewed.    UC Treatments / Results  Labs (all labs ordered are listed, but only abnormal results are displayed) Labs Reviewed - No data to display  EKG None Radiology Dg Wrist Complete Left  Result Date: 10/23/2017 CLINICAL DATA:  Larey SeatFell today hyperextending the left wrist, pain EXAM: LEFT WRIST - COMPLETE 3+ VIEW COMPARISON:  None. FINDINGS: The left radiocarpal joint space appears normal. The ulnar styloid is intact. The carpal bones are normal position with normal alignment. There is significant degenerative joint disease involving the left [facility] with loss of joint space, sclerosis, and  spurring present. IMPRESSION: 1. No acute fracture. 2. Advanced degenerative joint disease involves the left first CMC joint. Electronically Signed   By: Dwyane DeePaul  Barry M.D.   On: 10/23/2017 16:06    Procedures Procedures (including critical care time)  Medications Ordered in UC Medications - No data to display   Initial Impression / Assessment and Plan / UC Course  I have reviewed the triage vital signs and the nursing notes.  Pertinent labs & imaging results that were available during my care of the patient were reviewed by me and considered in my medical decision making (see chart for details).     No fracture on x-ray.  Does show some degenerative changes of the carpometacarpal joint.  Likely wrist sprain.  Will provide brace and conservative treatment with anti-inflammatories, rest, ice. Discussed strict return precautions. Patient verbalized understanding and is agreeable with plan.   Final Clinical Impressions(s) / UC Diagnoses   Final diagnoses:  Left wrist pain    ED Discharge Orders    None       Controlled Substance Prescriptions [place] Controlled Substance Registry consulted? Not Applicable   Lew DawesWieters, Darnette Lampron C, [place] 10/23/17 1627

## 2017-10-29 DIAGNOSIS — J301 Allergic rhinitis due to pollen: Secondary | ICD-10-CM | POA: Diagnosis not present

## 2017-10-29 DIAGNOSIS — J3089 Other allergic rhinitis: Secondary | ICD-10-CM | POA: Diagnosis not present

## 2017-10-29 DIAGNOSIS — J3081 Allergic rhinitis due to animal (cat) (dog) hair and dander: Secondary | ICD-10-CM | POA: Diagnosis not present

## 2017-12-05 DIAGNOSIS — J3089 Other allergic rhinitis: Secondary | ICD-10-CM | POA: Diagnosis not present

## 2017-12-05 DIAGNOSIS — J301 Allergic rhinitis due to pollen: Secondary | ICD-10-CM | POA: Diagnosis not present

## 2017-12-05 DIAGNOSIS — J3081 Allergic rhinitis due to animal (cat) (dog) hair and dander: Secondary | ICD-10-CM | POA: Diagnosis not present

## 2018-01-08 DIAGNOSIS — J3089 Other allergic rhinitis: Secondary | ICD-10-CM | POA: Diagnosis not present

## 2018-01-08 DIAGNOSIS — J301 Allergic rhinitis due to pollen: Secondary | ICD-10-CM | POA: Diagnosis not present

## 2018-01-08 DIAGNOSIS — J3081 Allergic rhinitis due to animal (cat) (dog) hair and dander: Secondary | ICD-10-CM | POA: Diagnosis not present

## 2018-02-07 DIAGNOSIS — J301 Allergic rhinitis due to pollen: Secondary | ICD-10-CM | POA: Diagnosis not present

## 2018-02-08 DIAGNOSIS — J301 Allergic rhinitis due to pollen: Secondary | ICD-10-CM | POA: Diagnosis not present

## 2018-02-08 DIAGNOSIS — J3089 Other allergic rhinitis: Secondary | ICD-10-CM | POA: Diagnosis not present

## 2018-02-08 DIAGNOSIS — J3081 Allergic rhinitis due to animal (cat) (dog) hair and dander: Secondary | ICD-10-CM | POA: Diagnosis not present

## 2018-03-14 DIAGNOSIS — J3081 Allergic rhinitis due to animal (cat) (dog) hair and dander: Secondary | ICD-10-CM | POA: Diagnosis not present

## 2018-03-14 DIAGNOSIS — J301 Allergic rhinitis due to pollen: Secondary | ICD-10-CM | POA: Diagnosis not present

## 2018-03-14 DIAGNOSIS — J3089 Other allergic rhinitis: Secondary | ICD-10-CM | POA: Diagnosis not present

## 2018-03-19 DIAGNOSIS — J3089 Other allergic rhinitis: Secondary | ICD-10-CM | POA: Diagnosis not present

## 2018-03-19 DIAGNOSIS — J3081 Allergic rhinitis due to animal (cat) (dog) hair and dander: Secondary | ICD-10-CM | POA: Diagnosis not present

## 2018-03-19 DIAGNOSIS — J301 Allergic rhinitis due to pollen: Secondary | ICD-10-CM | POA: Diagnosis not present

## 2018-03-25 DIAGNOSIS — J3089 Other allergic rhinitis: Secondary | ICD-10-CM | POA: Diagnosis not present

## 2018-03-25 DIAGNOSIS — J301 Allergic rhinitis due to pollen: Secondary | ICD-10-CM | POA: Diagnosis not present

## 2018-03-25 DIAGNOSIS — J3081 Allergic rhinitis due to animal (cat) (dog) hair and dander: Secondary | ICD-10-CM | POA: Diagnosis not present

## 2018-03-29 DIAGNOSIS — J3089 Other allergic rhinitis: Secondary | ICD-10-CM | POA: Diagnosis not present

## 2018-03-29 DIAGNOSIS — J301 Allergic rhinitis due to pollen: Secondary | ICD-10-CM | POA: Diagnosis not present

## 2018-03-29 DIAGNOSIS — J3081 Allergic rhinitis due to animal (cat) (dog) hair and dander: Secondary | ICD-10-CM | POA: Diagnosis not present

## 2018-04-01 DIAGNOSIS — J3089 Other allergic rhinitis: Secondary | ICD-10-CM | POA: Diagnosis not present

## 2018-04-01 DIAGNOSIS — J3081 Allergic rhinitis due to animal (cat) (dog) hair and dander: Secondary | ICD-10-CM | POA: Diagnosis not present

## 2018-04-01 DIAGNOSIS — J301 Allergic rhinitis due to pollen: Secondary | ICD-10-CM | POA: Diagnosis not present

## 2018-04-05 DIAGNOSIS — J301 Allergic rhinitis due to pollen: Secondary | ICD-10-CM | POA: Diagnosis not present

## 2018-04-05 DIAGNOSIS — J3081 Allergic rhinitis due to animal (cat) (dog) hair and dander: Secondary | ICD-10-CM | POA: Diagnosis not present

## 2018-04-05 DIAGNOSIS — J3089 Other allergic rhinitis: Secondary | ICD-10-CM | POA: Diagnosis not present

## 2018-04-14 ENCOUNTER — Encounter: Payer: Self-pay | Admitting: Physician Assistant

## 2018-04-14 ENCOUNTER — Ambulatory Visit: Payer: Self-pay | Admitting: Physician Assistant

## 2018-04-14 VITALS — BP 100/68 | HR 79 | Temp 97.5°F | Wt 165.0 lb

## 2018-04-14 DIAGNOSIS — R300 Dysuria: Secondary | ICD-10-CM

## 2018-04-14 LAB — POCT URINALYSIS DIPSTICK
BILIRUBIN UA: NEGATIVE
Glucose, UA: NEGATIVE
KETONES UA: NEGATIVE
Leukocytes, UA: NEGATIVE
NITRITE UA: NEGATIVE
PH UA: 6.5 (ref 5.0–8.0)
PROTEIN UA: NEGATIVE
RBC UA: NEGATIVE
SPEC GRAV UA: 1.02 (ref 1.010–1.025)
UROBILINOGEN UA: 0.2 U/dL

## 2018-04-14 MED ORDER — CEPHALEXIN 500 MG PO CAPS: 500.0000 mg | ORAL_CAPSULE | Freq: Two times a day (BID) | ORAL | 0 refills | Status: AC

## 2018-04-14 NOTE — Progress Notes (Addendum)
   Patient presents to clinic today c/o 2 days of suprapubic pressure, dysuria, urinary urgency and frequency. Denies fever, chills, nausea/vomiting, back pain. Has kept well-hydrated and is taking in cranberry juice. Notes prior history of UTI. States this feels similar to prior episodes.   Past Medical History:  Diagnosis Date  . PMS (premenstrual syndrome)     No current outpatient medications on file prior to visit.   No current facility-administered medications on file prior to visit.     No Known Allergies  Family History  Problem Relation Age of Onset  . Hypertension Other   . Hyperlipidemia Other     Social History   Socioeconomic History  . Marital status: Divorced    Spouse name: Not on file  . Number of children: Not on file  . Years of education: Not on file  . Highest education level: Not on file  Occupational History  . Not on file  Social Needs  . Financial resource strain: Not on file  . Food insecurity:    Worry: Not on file    Inability: Not on file  . Transportation needs:    Medical: Not on file    Non-medical: Not on file  Tobacco Use  . Smoking status: Never Smoker  . Smokeless tobacco: Never Used  Substance and Sexual Activity  . Alcohol use: No  . Drug use: No  . Sexual activity: Not on file  Lifestyle  . Physical activity:    Days per week: Not on file    Minutes per session: Not on file  . Stress: Not on file  Relationships  . Social connections:    Talks on phone: Not on file    Gets together: Not on file    Attends religious service: Not on file    Active member of club or organization: Not on file    Attends meetings of clubs or organizations: Not on file    Relationship status: Not on file  Other Topics Concern  . Not on file  Social History Narrative  . Not on file   Review of Systems - See HPI.  All other ROS are negative.  BP 100/68   Pulse 79   Temp (!) 97.5 F (36.4 C)   Wt 165 lb (74.8 kg)   SpO2 100%   BMI  26.63 kg/m   Physical Exam  Constitutional: She is oriented to person, place, and time. She appears well-developed and well-nourished.  HENT:  Head: Normocephalic and atraumatic.  Eyes: Conjunctivae are normal.  Neck: Neck supple.  Cardiovascular: Normal rate, regular rhythm, normal heart sounds and intact distal pulses.  Pulmonary/Chest: Effort normal and breath sounds normal. No stridor. No respiratory distress. She has no wheezes. She has no rales. She exhibits no tenderness.  Abdominal: Soft. Bowel sounds are normal. She exhibits no distension. There is no tenderness.  Negative CVA tenderness  Neurological: She is alert and oriented to person, place, and time.  Psychiatric: She has a normal mood and affect.  Vitals reviewed.  Assessment/Plan: 1. Dysuria Urine dip negative however with classic symptoms and + history. Will start empiric Keflex for suspected cystitis. Supportive measures reviewed. Encouraged her to follow-up with PCP for assessment if not resolving as this would warrant need for repeat UA and culture which we cannot get here.   - POCT Urinalysis Dipstick   Piedad Climes, PA-C

## 2018-04-14 NOTE — Patient Instructions (Signed)
Your symptoms are consistent with a bladder infection, also called acute cystitis. Please take your antibiotic (Keflex) as directed until all pills are gone.  Stay very well hydrated.  Consider a daily probiotic (Align, Culturelle, or Activia) to help prevent stomach upset caused by the antibiotic.  Taking a probiotic daily may also help prevent recurrent UTIs.  Also consider taking AZO (Phenazopyridine) tablets to help decrease pain with urination.   Since we are unable to perform a urine culture here at [facility], if symptoms are not improving over the next couple of days and resolving with antibiotic, you need to follow-up with your primary care for an urine culture.   Urinary Tract Infection A urinary tract infection (UTI) can occur any place along the urinary tract. The tract includes the kidneys, ureters, bladder, and urethra. A type of germ called bacteria often causes a UTI. UTIs are often helped with antibiotic medicine.  HOME CARE   If given, take antibiotics as told by your doctor. Finish them even if you start to feel better.  Drink enough fluids to keep your pee (urine) clear or pale yellow.  Avoid tea, drinks with caffeine, and bubbly (carbonated) drinks.  Pee often. Avoid holding your pee in for a long time.  Pee before and after having sex (intercourse).  Wipe from front to back after you poop (bowel movement) if you are a woman. Use each tissue only once. GET HELP RIGHT AWAY IF:   You have back pain.  You have lower belly (abdominal) pain.  You have chills.  You feel sick to your stomach (nauseous).  You throw up (vomit).  Your burning or discomfort with peeing does not go away.  You have a fever.  Your symptoms are not better in 3 days. MAKE SURE YOU:   Understand these instructions.  Will watch your condition.  Will get help right away if you are not doing well or get worse. Document Released: 12/20/2007 Document Revised: 03/27/2012 Document Reviewed:  02/01/2012 [facility] Patient Information 2015 [place], [place]. This information is not intended to replace advice given to you by your health care provider. Make sure you discuss any questions you have with your health care provider.

## 2018-04-30 DIAGNOSIS — J3089 Other allergic rhinitis: Secondary | ICD-10-CM | POA: Diagnosis not present

## 2018-04-30 DIAGNOSIS — J301 Allergic rhinitis due to pollen: Secondary | ICD-10-CM | POA: Diagnosis not present

## 2018-04-30 DIAGNOSIS — J3081 Allergic rhinitis due to animal (cat) (dog) hair and dander: Secondary | ICD-10-CM | POA: Diagnosis not present

## 2018-05-03 DIAGNOSIS — J3089 Other allergic rhinitis: Secondary | ICD-10-CM | POA: Diagnosis not present

## 2018-05-03 DIAGNOSIS — J301 Allergic rhinitis due to pollen: Secondary | ICD-10-CM | POA: Diagnosis not present

## 2018-06-11 DIAGNOSIS — J3089 Other allergic rhinitis: Secondary | ICD-10-CM | POA: Diagnosis not present

## 2018-06-11 DIAGNOSIS — J3081 Allergic rhinitis due to animal (cat) (dog) hair and dander: Secondary | ICD-10-CM | POA: Diagnosis not present

## 2018-06-11 DIAGNOSIS — J301 Allergic rhinitis due to pollen: Secondary | ICD-10-CM | POA: Diagnosis not present

## 2018-07-12 DIAGNOSIS — J3089 Other allergic rhinitis: Secondary | ICD-10-CM | POA: Diagnosis not present

## 2018-07-12 DIAGNOSIS — J3081 Allergic rhinitis due to animal (cat) (dog) hair and dander: Secondary | ICD-10-CM | POA: Diagnosis not present

## 2018-07-12 DIAGNOSIS — J301 Allergic rhinitis due to pollen: Secondary | ICD-10-CM | POA: Diagnosis not present

## 2018-07-15 DIAGNOSIS — J3081 Allergic rhinitis due to animal (cat) (dog) hair and dander: Secondary | ICD-10-CM | POA: Diagnosis not present

## 2018-08-12 DIAGNOSIS — J3081 Allergic rhinitis due to animal (cat) (dog) hair and dander: Secondary | ICD-10-CM | POA: Diagnosis not present

## 2018-08-12 DIAGNOSIS — J301 Allergic rhinitis due to pollen: Secondary | ICD-10-CM | POA: Diagnosis not present

## 2018-08-12 DIAGNOSIS — J3089 Other allergic rhinitis: Secondary | ICD-10-CM | POA: Diagnosis not present

## 2018-09-09 DIAGNOSIS — J3081 Allergic rhinitis due to animal (cat) (dog) hair and dander: Secondary | ICD-10-CM | POA: Diagnosis not present

## 2018-09-09 DIAGNOSIS — J3089 Other allergic rhinitis: Secondary | ICD-10-CM | POA: Diagnosis not present

## 2018-09-09 DIAGNOSIS — J301 Allergic rhinitis due to pollen: Secondary | ICD-10-CM | POA: Diagnosis not present

## 2018-09-21 IMAGING — DX DG WRIST COMPLETE 3+V*L*
4 series · 4 of 4 positions shown · non-contrast
Comparison: None.

CLINICAL DATA: Fell today hyperextending the left wrist, pain

EXAM:
LEFT WRIST - COMPLETE 3+ VIEW

[wrist pa]
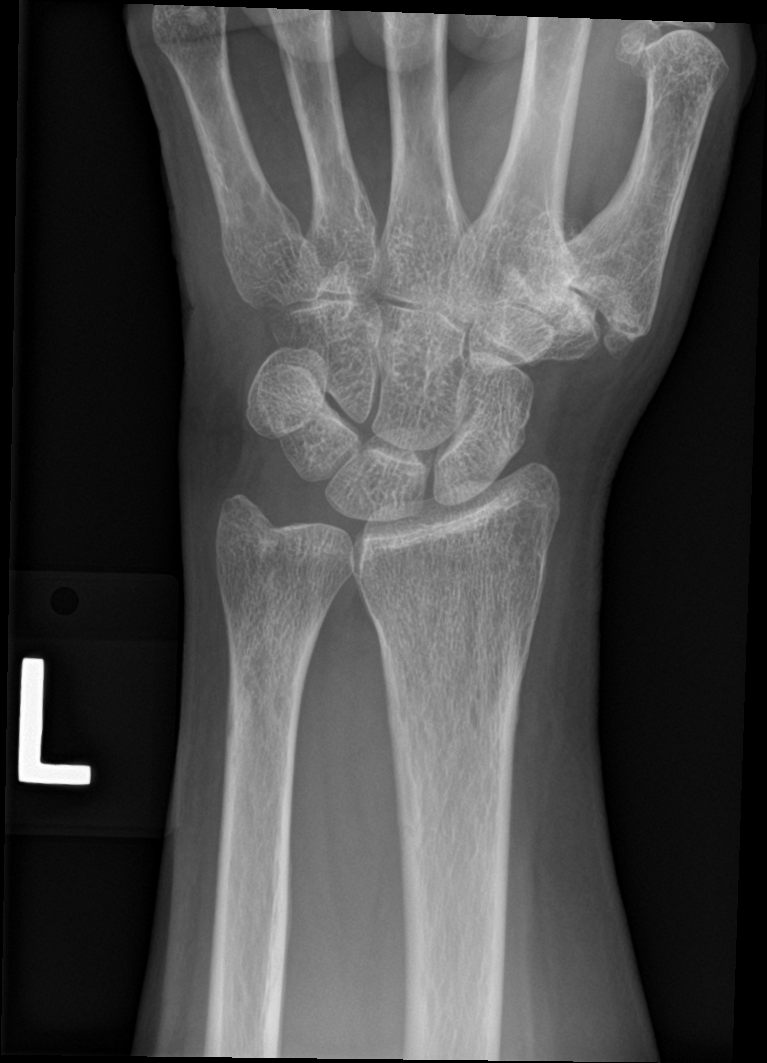

[wrist navicular]
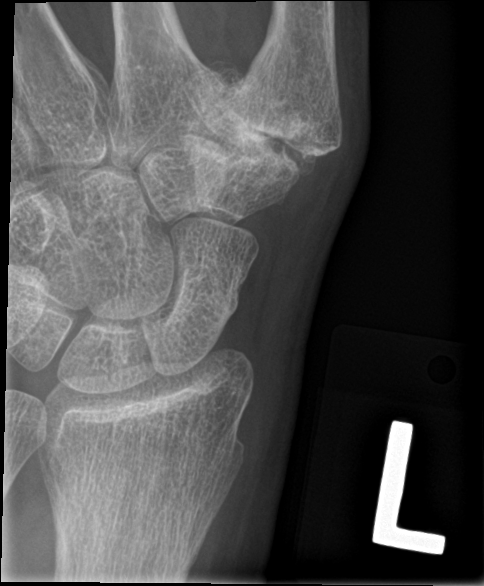

[wrist obl]
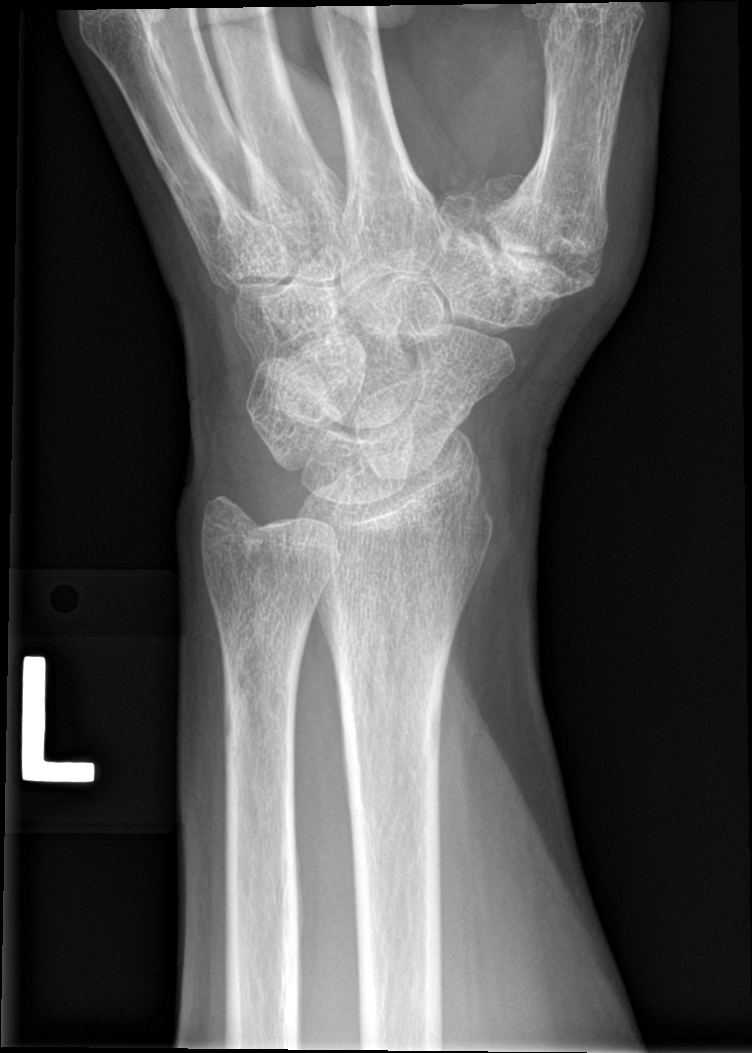

[wrist lat]
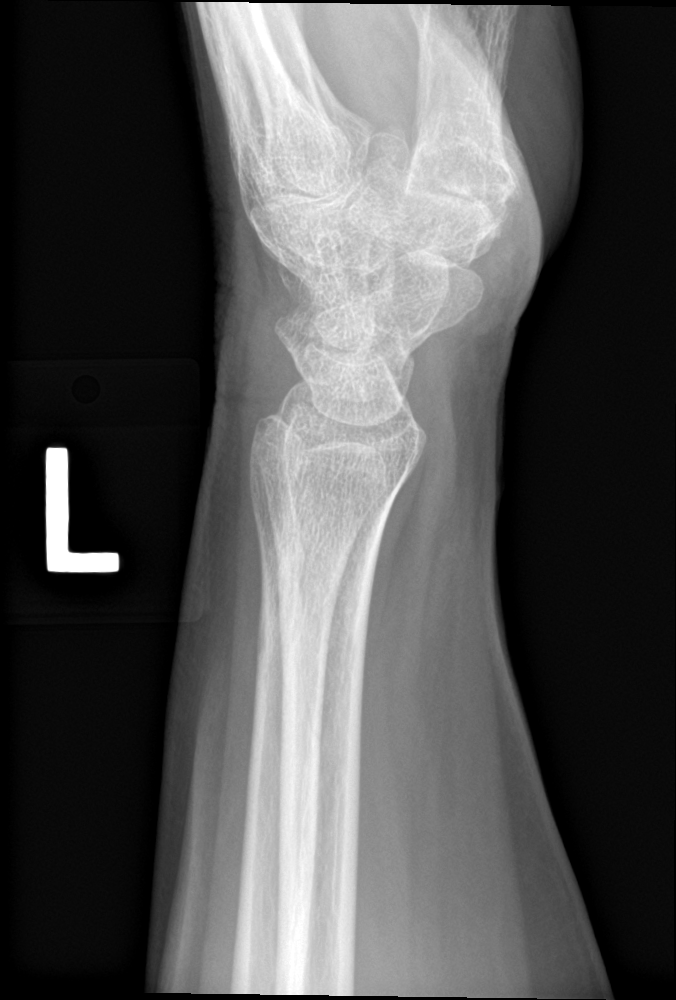

[4 of 4 positions shown; findings below may reference images not displayed]

FINDINGS: The left radiocarpal joint space appears normal. The ulnar styloid
is intact. The carpal bones are normal position with normal
alignment. There is significant degenerative joint disease involving
the left first CMC joint with loss of joint space, sclerosis, and
spurring present.
IMPRESSION: 1. No acute fracture.
2. Advanced degenerative joint disease involves the left first CMC
joint.

## 2019-08-06 ENCOUNTER — Ambulatory Visit: Payer: Medicare Other | Attending: Internal Medicine

## 2019-08-06 DIAGNOSIS — Z23 Encounter for immunization: Secondary | ICD-10-CM | POA: Insufficient documentation

## 2019-08-06 NOTE — Progress Notes (Signed)
   Covid-19 Vaccination Clinic  Name:  Elysia Grand    MRN: [id number] DOB: September 25, 1955  08/06/2019  Ms. Mynhier was observed post Covid-19 immunization for 15 minutes without incidence. She was provided with Vaccine Information Sheet and instruction to access the V-Safe system.   Ms. Lastra was instructed to call 911 with any severe reactions post vaccine: Marland Kitchen Difficulty breathing  . Swelling of your face and throat  . A fast heartbeat  . A bad rash all over your body  . Dizziness and weakness    Immunizations Administered    Name Date Dose VIS Date Route   Pfizer COVID-19 Vaccine 08/06/2019  1:43 PM 0.3 mL 06/27/2019 Intramuscular   Manufacturer: [employer], [employer]   Lot: [id number]   NDC: [address]-0

## 2019-08-24 ENCOUNTER — Ambulatory Visit: Payer: BLUE CROSS/BLUE SHIELD

## 2019-08-27 ENCOUNTER — Ambulatory Visit: Payer: Medicare Other | Attending: Internal Medicine

## 2019-08-27 DIAGNOSIS — Z23 Encounter for immunization: Secondary | ICD-10-CM | POA: Insufficient documentation

## 2019-08-27 NOTE — Progress Notes (Signed)
   Covid-19 Vaccination Clinic  Name:  Tracy Burns    MRN: [id number] DOB: Aug 06, 1955  08/27/2019  [name] was observed post Covid-19 immunization for 15 minutes without incidence. She was provided with Vaccine Information Sheet and instruction to access the V-Safe system.   [name] was instructed to call 911 with any severe reactions post vaccine: Marland Kitchen Difficulty breathing  . Swelling of your face and throat  . A fast heartbeat  . A bad rash all over your body  . Dizziness and weakness    Immunizations Administered    Name Date Dose VIS Date Route   Pfizer COVID-19 Vaccine 08/27/2019  2:54 PM 0.3 mL 06/27/2019 Intramuscular   Manufacturer: [employer], [employer]   Lot: [id number]   NDC: [address]-3

## 2020-05-02 ENCOUNTER — Emergency Department (HOSPITAL_COMMUNITY)
Admission: EM | Admit: 2020-05-02 | Discharge: 2020-05-02 | Discharge status: Absent without leave | Payer: Medicare Other

## 2020-05-02 ENCOUNTER — Ambulatory Visit (HOSPITAL_COMMUNITY): Admit: 2020-05-02 | Discharge status: Absent without leave | Payer: Medicare Other

## 2020-05-12 ENCOUNTER — Encounter (HOSPITAL_COMMUNITY): Payer: Self-pay | Admitting: Emergency Medicine

## 2020-05-12 ENCOUNTER — Ambulatory Visit (HOSPITAL_COMMUNITY)
Admission: EM | Admit: 2020-05-12 | Discharge: 2020-05-12 | Disposition: A | Discharge status: Home / Self Care | Payer: Medicare Other | Attending: Family Medicine | Admitting: Family Medicine

## 2020-05-12 ENCOUNTER — Other Ambulatory Visit: Payer: Self-pay

## 2020-05-12 DIAGNOSIS — R300 Dysuria: Secondary | ICD-10-CM | POA: Diagnosis present

## 2020-05-12 DIAGNOSIS — R350 Frequency of micturition: Secondary | ICD-10-CM | POA: Insufficient documentation

## 2020-05-12 LAB — POCT URINALYSIS DIPSTICK, ED / UC
Bilirubin Urine: NEGATIVE
Glucose, UA: NEGATIVE mg/dL
Hgb urine dipstick: NEGATIVE
Ketones, ur: NEGATIVE mg/dL
Leukocytes,Ua: NEGATIVE
Nitrite: NEGATIVE
Protein, ur: NEGATIVE mg/dL
Specific Gravity, Urine: 1.01 (ref 1.005–1.030)
Urobilinogen, UA: 0.2 mg/dL (ref 0.0–1.0)
pH: 6 (ref 5.0–8.0)

## 2020-05-12 NOTE — ED Provider Notes (Signed)
[facility]    CSN: [id number] Arrival date & time: 05/12/20  2694      History   Chief Complaint Chief Complaint  Patient presents with  . Dysuria    HPI Tracy Burns is a 63 y.o. female.   Presenting today with 2 day hx of dysuria, urinary frequency. Denies flank pain, abdominal pain, fever, chills, N/V/D, vaginal discharge or rashes. Has been increasing fluid intake and drinking cranberry juice and notes improvement in dysuria but still having some frequency. Has had UTIs in the past and typically able to flush them out this way but has gotten pyelonephritis once in her 59s that was significant.      Past Medical History:  Diagnosis Date  . PMS (premenstrual syndrome)     Patient Active Problem List   Diagnosis Date Noted  . Allergic rhinitis 12/27/2016  . DISORDER, MENOPAUSAL NOS 03/22/2007  . PAIN IN JOINT, UPPER ARM 01/18/2006    Past Surgical History:  Procedure Laterality Date  . APPENDECTOMY      OB History   No obstetric history on file.      Home Medications    Prior to Admission medications   Not on File    Family History Family History  Problem Relation Age of Onset  . Hypertension Other   . Hyperlipidemia Other     Social History Social History   Tobacco Use  . Smoking status: Never Smoker  . Smokeless tobacco: Never Used  Substance Use Topics  . Alcohol use: No  . Drug use: No     Allergies   Patient has no known allergies.   Review of Systems Review of Systems PER HPI    Physical Exam Triage Vital Signs ED Triage Vitals  Enc Vitals Group     BP 05/12/20 0859 139/75     Pulse Rate 05/12/20 0859 79     Resp 05/12/20 0859 17     Temp 05/12/20 0859 97.6 F (36.4 C)     Temp Source 05/12/20 0859 Oral     SpO2 05/12/20 0859 100 %     Weight --      Height --      Head Circumference --      Peak Flow --      Pain Score 05/12/20 0857 0     Pain Loc --      Pain Edu? --      Excl. in GC? --     No data found.  Updated Vital Signs BP 139/75 (BP Location: Left Arm)   Pulse 79   Temp 97.6 F (36.4 C) (Oral)   Resp 17   SpO2 100%   Visual Acuity Right Eye Distance:   Left Eye Distance:   Bilateral Distance:    Right Eye Near:   Left Eye Near:    Bilateral Near:     Physical Exam Vitals and nursing note reviewed.  Constitutional:      Appearance: Normal appearance. She is not ill-appearing.  HENT:     Head: Atraumatic.  Eyes:     Extraocular Movements: Extraocular movements intact.     Conjunctiva/sclera: Conjunctivae normal.  Cardiovascular:     Rate and Rhythm: Normal rate and regular rhythm.     Heart sounds: Normal heart sounds.  Pulmonary:     Effort: Pulmonary effort is normal.     Breath sounds: Normal breath sounds.  Abdominal:     General: Bowel sounds are normal. There is no distension.  Palpations: Abdomen is soft.     Tenderness: There is no abdominal tenderness. There is no right CVA tenderness, left CVA tenderness or guarding.  Genitourinary:    Comments: GU exam deferred, self swab performed Musculoskeletal:        General: Normal range of motion.     Cervical back: Normal range of motion and neck supple.  Skin:    General: Skin is warm and dry.  Neurological:     Mental Status: She is alert and oriented to person, place, and time.  Psychiatric:        Mood and Affect: Mood normal.        Thought Content: Thought content normal.        Judgment: Judgment normal.      UC Treatments / Results  Labs (all labs ordered are listed, but only abnormal results are displayed) Labs Reviewed  POCT URINALYSIS DIPSTICK, ED / UC  CERVICOVAGINAL ANCILLARY ONLY    EKG   Radiology No results found.  Procedures Procedures (including critical care time)  Medications Ordered in UC Medications - No data to display  Initial Impression / Assessment and Plan / UC Course  I have reviewed the triage vital signs and the nursing  notes.  Pertinent labs & imaging results that were available during my care of the patient were reviewed by me and considered in my medical decision making (see chart for details).     U/A today without abnormality, vaginal swab pending to r/o vaginitis as cause of her sxs. Sxs seem to be resolving with her home supportive care. Continue to monitor for improvement. F/u if sxs worsening again.  Final Clinical Impressions(s) / UC Diagnoses   Final diagnoses:  Urinary frequency  Dysuria   Discharge Instructions   None    ED Prescriptions    None     PDMP not reviewed this encounter.   Particia Nearing, [place] 05/12/20 1032

## 2020-05-12 NOTE — ED Triage Notes (Signed)
Pt presents with urinary frequency and dysuria xs 2 days.

## 2020-05-13 LAB — CERVICOVAGINAL ANCILLARY ONLY
Bacterial Vaginitis (gardnerella): NEGATIVE
Candida Glabrata: NEGATIVE
Candida Vaginitis: NEGATIVE
Comment: NEGATIVE
Comment: NEGATIVE
Comment: NEGATIVE

## 2021-02-15 ENCOUNTER — Ambulatory Visit (INDEPENDENT_AMBULATORY_CARE_PROVIDER_SITE_OTHER): Payer: Medicare Other | Admitting: Internal Medicine

## 2021-02-15 ENCOUNTER — Encounter: Payer: Self-pay | Admitting: Internal Medicine

## 2021-02-15 ENCOUNTER — Other Ambulatory Visit: Payer: Self-pay

## 2021-02-15 ENCOUNTER — Ambulatory Visit: Payer: Medicare Other | Admitting: Internal Medicine

## 2021-02-15 VITALS — BP 130/70 | HR 82 | Temp 97.9°F | Ht 66.0 in | Wt 179.6 lb

## 2021-02-15 DIAGNOSIS — S46912A Strain of unspecified muscle, fascia and tendon at shoulder and upper arm level, left arm, initial encounter: Secondary | ICD-10-CM
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
## 2021-02-28 ENCOUNTER — Other Ambulatory Visit: Payer: Self-pay
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
## 2021-03-01 ENCOUNTER — Ambulatory Visit (INDEPENDENT_AMBULATORY_CARE_PROVIDER_SITE_OTHER): Payer: Medicare Other | Admitting: Internal Medicine

## 2021-03-01 ENCOUNTER — Encounter: Payer: Self-pay | Admitting: Internal Medicine

## 2021-03-01 VITALS — BP 118/82 | HR 86 | Temp 97.9°F | Ht 66.0 in | Wt 180.6 lb

## 2021-03-01 DIAGNOSIS — S46912S Strain of unspecified muscle, fascia and tendon at shoulder and upper arm level, left arm, sequela: Secondary | ICD-10-CM

## 2021-03-01 DIAGNOSIS — S46912A Strain of unspecified muscle, fascia and tendon at shoulder and upper arm level, left arm, initial encounter: Secondary | ICD-10-CM | POA: Diagnosis not present

## 2021-03-01 NOTE — Patient Instructions (Addendum)
Glad  you are doing better.  Continue with the physical  treatments   Expect  resolution may take  4-6 weeks . Get back with [place] if  needed. At that time.

## 2021-08-02 ENCOUNTER — Ambulatory Visit (HOSPITAL_BASED_OUTPATIENT_CLINIC_OR_DEPARTMENT_OTHER): Payer: Medicare Other | Admitting: Nurse Practitioner

## 2021-08-08 ENCOUNTER — Other Ambulatory Visit: Payer: Self-pay | Admitting: Family Medicine

## 2021-08-08 DIAGNOSIS — E2839 Other primary ovarian failure: Secondary | ICD-10-CM

## 2021-08-08 DIAGNOSIS — Z1231 Encounter for screening mammogram for malignant neoplasm of breast: Secondary | ICD-10-CM

## 2022-01-09 ENCOUNTER — Ambulatory Visit
Admission: RE | Admit: 2022-01-09 | Discharge: 2022-01-09 | Disposition: A | Discharge status: Home / Self Care | Payer: Medicare Other | Source: Ambulatory Visit | Attending: Family Medicine | Admitting: Family Medicine

## 2022-01-09 DIAGNOSIS — Z1231 Encounter for screening mammogram for malignant neoplasm of breast: Secondary | ICD-10-CM

## 2022-01-09 DIAGNOSIS — E2839 Other primary ovarian failure: Secondary | ICD-10-CM

## 2022-02-25 ENCOUNTER — Telehealth: Payer: Medicare Other | Admitting: Nurse Practitioner

## 2022-02-25 DIAGNOSIS — U071 COVID-19: Secondary | ICD-10-CM

## 2022-02-25 NOTE — Patient Instructions (Signed)
  Tracy Burns, thank you for joining [name], NP for today's virtual visit.  While this provider is not your primary care provider (PCP), if your PCP is located in our provider database this encounter information will be shared with them immediately following your visit.  Consent: (Patient) Tracy Burns provided verbal consent for this virtual visit at the beginning of the encounter.  Current Medications: No current outpatient medications on file.   Medications ordered in this encounter:  No orders of the defined types were placed in this encounter.    *If you need refills on other medications prior to your next appointment, please contact your pharmacy*  Follow-Up: Call back or seek an in-person evaluation if the symptoms worsen or if the condition fails to improve as anticipated.  Other Instructions Hold off on antiviral for now. Patient is in agreeance Will call PCP or reach out to [place] via virtual visit within 3 days from symptom onset if symptoms worsen.  INSTRUCTIONS: use a humidifier for nasal congestion Drink plenty of fluids, rest and wash hands frequently to avoid the spread of infection Alternate tylenol and Motrin for relief of fever    If you have been instructed to have an in-person evaluation today at a local Urgent Care facility, please use the link below. It will take you to a list of all of our available [facility], including address, phone number and hours of operation. Please do not delay care.  [facility]  If you or a family member do not have a primary care provider, use the link below to schedule a visit and establish care. When you choose a [facility] primary care physician or advanced practice provider, you gain a long-term partner in health. Find a Primary Care Provider  Learn more about [facility]'s in-office and virtual care options: [facility] - Get Care Now

## 2022-02-25 NOTE — Progress Notes (Signed)
Virtual Visit Consent   Tracy Burns, you are scheduled for a virtual visit with a Tracy Burns Burns provider today. Just as with appointments in the office, your consent must be obtained to participate. Your consent will be active for this visit and any virtual visit you may have with one of our providers in the next 365 days. If you have a MyChart account, a copy of this consent can be sent to you electronically.  As this is a virtual visit, video technology does not allow for your provider to perform a traditional examination. This may limit your provider's ability to fully assess your condition. If your provider identifies any concerns that need to be evaluated in person or the need to arrange testing (such as labs, EKG, etc.), we will make arrangements to do so. Although advances in technology are sophisticated, we cannot ensure that it will always work on either your end or our end. If the connection with a video visit is poor, the visit may have to be switched to a telephone visit. With either a video or telephone visit, we are not always able to ensure that we have a secure connection.  By engaging in this virtual visit, you consent to the provision of healthcare and authorize for your insurance to be billed (if applicable) for the services provided during this visit. Depending on your insurance coverage, you may receive a charge related to this service.  I need to obtain your verbal consent now. Are you willing to proceed with your visit today? Tracy Burns has provided verbal consent on 02/25/2022 for a virtual visit (video or telephone). Tracy Rigg, NP  Date: 02/25/2022 2:52 PM  Virtual Visit via Video Note   I, Tracy Burns, connected with  Tracy Burns  ([id number], 12-28-1955) on 02/25/22 at  2:30 PM EDT by a video-enabled telemedicine application and verified that I am speaking with the correct person using two identifiers.  Location: Patient: Virtual Visit Location Patient:  Home Provider: Virtual Visit Location Provider: Home Office   I discussed the limitations of evaluation and management by telemedicine and the availability of in person appointments. The patient expressed understanding and agreed to proceed.    History of Present Illness: Tracy Burns is a 66 y.o. who identifies as a female who was assigned female at birth, and is being seen today for COVID POSITIVE ANTIGEN HOME TEST.   Tracy Burns states she spent some time out earlier this week with friends and was notified by someone in the group that they were COVID positive. She took a home test this morning and it read positive for COVID. Currently symptoms are very mild and only include sore throat and runny nose. She does have an elderly mother that lives with her. We discussed quarantine recommendations today as well as home therapy for current symptoms.  Problems:  Patient Active Problem List   Diagnosis Date Noted   Allergic rhinitis 12/27/2016   DISORDER, MENOPAUSAL NOS 03/22/2007   PAIN IN JOINT, UPPER ARM 01/18/2006    Allergies: No Known Allergies Medications: No current outpatient medications on file.  Observations/Objective: Patient is well-developed, well-nourished in no acute distress.  Resting comfortably at home.  Head is normocephalic, atraumatic.  No labored breathing.  Speech is clear and coherent with logical content.  Patient is alert and oriented at baseline.    Assessment and Plan: 1. Positive self-administered antigen test for COVID-19 Hold off on antiviral for now. Patient is in agreeance Will call PCP or reach  out to [place] via virtual visit within 3 days from symptom onset if symptoms worsen. She has no significant comorbidities  INSTRUCTIONS: use a humidifier for nasal congestion Drink plenty of fluids, rest and wash hands frequently to avoid the spread of infection Alternate tylenol and Motrin for relief of fever   Follow Up Instructions: I discussed the  assessment and treatment plan with the patient. The patient was provided an opportunity to ask questions and all were answered. The patient agreed with the plan and demonstrated an understanding of the instructions.  A copy of instructions were sent to the patient via MyChart unless otherwise noted below.    The patient was advised to call back or seek an in-person evaluation if the symptoms worsen or if the condition fails to improve as anticipated.  Time:  I spent 7 minutes with the patient via telehealth technology discussing the above problems/concerns.    Tracy Rigg, NP

## 2024-03-05 NOTE — Therapy (Signed)
 OUTPATIENT PHYSICAL THERAPY LOWER EXTREMITY EVALUATION   Patient Name: Tracy Burns MRN: [id number] DOB:10/04/1955, 68 y.o., female Today's Date: 03/07/2024  END OF SESSION:  PT End of Session - 03/06/24 1510     Visit Number 1    Number of Visits 8    Date for PT Re-Evaluation 04/17/24    Authorization Type MCR A&B    PT Start Time 1405    PT Stop Time 1459    PT Time Calculation (min) 54 min    Activity Tolerance Patient tolerated treatment well    Behavior During Therapy [facility] for tasks assessed/performed          Past Medical History:  Diagnosis Date   PMS (premenstrual syndrome)    Past Surgical History:  Procedure Laterality Date   APPENDECTOMY     Patient Active Problem List   Diagnosis Date Noted   Allergic rhinitis 12/27/2016   Menopausal and postmenopausal disorder 03/22/2007   PAIN IN JOINT, UPPER ARM 01/18/2006    PCP: Waylan Almarie SAUNDERS, MD  REFERRING PROVIDER: Waylan Almarie SAUNDERS, MD  REFERRING DIAG: [phone] (ICD-10-CM) - Unspecified injury of right lower leg, initial encounter  M25.561 (ICD-10-CM) - Pain in right knee  THERAPY DIAG:  Right knee pain, unspecified chronicity  Muscle weakness (generalized)  Stiffness of right knee, not elsewhere classified  Rationale for Evaluation and Treatment: Rehabilitation  ONSET DATE: March 2025  SUBJECTIVE:   SUBJECTIVE STATEMENT: Pt reports having 3 injuries on R knee.  Her pain began in March when she stumbled when getting out of the car.  She struck her R knee on her car.  Pt saw MD and was informed her knee was bruised.  Pt later banged her R knee on a piece of marble when remodeling her home.  She then struck her knee on her car again when her foot got caught on the wheel block in the parking lot.  She noticed swelling in her posterior knee.  Pt saw MD on 12/25/23 and note indicated PT for R knee pain.  Pt states MD wanted pt to strengthen knee.  Pt has not had any imaging.  Pt states her knee is  improving.   Pt has pain with prolonged ambulation.  Pt would not walk a mile due to pain, but is able to ambulate in a grocery store without any issues.  Pt has difficulty with ambulating on uneven terrain.  Pt is able to perform her normal household chores.  She has fatigue with stairs.  Pt can have pain if she sleeps with her knees in a significantly bent position.   Pt is a [employer] member.  PERTINENT HISTORY: Arthritis Osteoporosis  PAIN:  NPRS:  0/10 current, 3-4/10 worst Location:  anteromedial R knee  PRECAUTIONS: Other: osteoporosis    WEIGHT BEARING RESTRICTIONS: No  FALLS:  Has patient fallen in last 6 months? No  LIVING ENVIRONMENT: Lives with: mother lives with her Lives in: home Stairs: yes Has following equipment at home: stair lift, cane, FWW, rollator, w/c  OCCUPATION: Retired  PLOF: Independent  PATIENT GOALS: improve flexibility and strength   OBJECTIVE:  Note: Objective measures were completed at Evaluation unless otherwise noted.  DIAGNOSTIC FINDINGS: None  PATIENT SURVEYS:  LEFS:  58/80  COGNITION: Overall cognitive status: Within functional limits for tasks assessed      PALPATION: Slightly tender to palpate R medial knee and no tenderness otherwise t/o R knee  LOWER EXTREMITY ROM:   ROM Right eval Left eval  Hip  flexion    Hip extension    Hip abduction    Hip adduction    Hip internal rotation    Hip external rotation    Knee flexion 113/119 129  Knee extension 3/1 deg AROM/PROM felt uncomfortable 4 deg of hyperextension  Ankle dorsiflexion    Ankle plantarflexion    Ankle inversion    Ankle eversion     (Blank rows = not tested)  LOWER EXTREMITY MMT:  MMT Right eval Left eval  Hip flexion 5/5 4+/5  Hip extension    Hip abduction 4/5 4+/5  Hip adduction    Hip internal rotation    Hip external rotation 4/5 5/5  Knee flexion 5/5 seated 5/5 seated   Knee extension 4-/5 4/5  Ankle dorsiflexion    Ankle  plantarflexion    Ankle inversion    Ankle eversion     (Blank rows = not tested)   FUNCTIONAL TESTS:  5x STS test:    GAIT: Assistive device utilized: None Level of assistance: Complete Independence Comments: WFL, heel to toe gait                                                                                                                                TREATMENT:   Pt performed supine heel slides x 10 reps and supine bridge x 10 reps.  Pt received a HEP handout and was educated in correct form and appropriate frequency.  PT instructed pt to not perform supine heel slides into a painful range  PATIENT EDUCATION:  Education details: dx, objective findings, relevant anatomy, POC, HEP, and rationale of interventions.   Person educated: Patient Education method: Explanation, Demonstration, Tactile cues, Verbal cues, and Handouts Education comprehension: verbalized understanding, returned demonstration, verbal cues required, tactile cues required, and needs further education  HOME EXERCISE PROGRAM: Access Code: [email or web address] URL: [email or web address] Date: 03/06/2024 Prepared by: Mose Minerva  Exercises - Supine Heel Slide  - 2 x daily - 7 x weekly - 2 sets - 10 reps - Supine Bridge  - 1 x daily - 7 x weekly - 2 sets - 10 reps  ASSESSMENT:  CLINICAL IMPRESSION: Patient is a 68 y.o. female with R knee pain who has had 3 occasions where she has struck her knee on an object.  Pt states her knee is improving.  Pt has pain with prolonged ambulation and difficulty with ambulating on uneven terrain.  She has fatigue with stairs.  Pt can have pain if she sleeps with her knees in a significantly bent position.  Pt has limited R knee flexion and extension ROM.  Pt has muscle weakness in bilat LE's R > L LE.  Pt should benefit from skilled PT to address impairments and improve overall function.   OBJECTIVE IMPAIRMENTS: decreased activity tolerance, decreased mobility, difficulty  walking, decreased ROM, decreased strength, hypomobility, increased edema, and pain.   ACTIVITY LIMITATIONS: sleeping, stairs, and locomotion level  PARTICIPATION LIMITATIONS:  community activity  PERSONAL FACTORS: 1-2 comorbidities: arthritis and osteoporosis are also affecting patient's functional outcome.   REHAB POTENTIAL: Good  CLINICAL DECISION MAKING: Stable/uncomplicated  EVALUATION COMPLEXITY: Low   GOALS:   SHORT TERM GOALS: Target date: 03/27/24  Pt will be independent and compliant with HEP for improved pain, ROM, strength, and function.   Baseline: Goal status: INITIAL  2.  Pt will demo improved R knee AROM to 0 - 120 deg for improved mobility and stiffness.  Baseline:  Goal status: INITIAL    LONG TERM GOALS: Target date: 04/17/2024  Pt will be able to ambulate on uneven terrain without increased pain or significant difficulty.   Baseline:  Goal status: INITIAL  2.  Pt will demo improved R hip and knee strength to 5/5 MMT for improved performance of and tolerance with functional mobility.  Baseline:  Goal status: INITIAL  3.  Pt will report at least a 70% improvement in pain and sx's overall.  Baseline:  Goal status: INITIAL  4.  Pt will report she is able to perform stairs without difficulty and demonstrate a reciprocal gait with the rail.   Baseline:  Goal status: INITIAL  5.  Pt will report she is able to ambulate extended community distance without increased pain.  Baseline:  Goal status: INITIAL    PLAN:  PT FREQUENCY: 1-2x/wk  PT DURATION: 6 weeks  PLANNED INTERVENTIONS: 97164- PT Re-evaluation, 97750- Physical Performance Testing, 97110-Therapeutic exercises, 97530- Therapeutic activity, [id number]- Neuromuscular re-education, 97535- Self Care, 02859- Manual therapy, [id number]- Gait training, [phone]- Aquatic Therapy, [phone]- Electrical stimulation (unattended), [id number]- Electrical stimulation (manual), [id number]- Ultrasound, Patient/Family education,  Balance training, Stair training, Taping, Cryotherapy, and Moist heat  PLAN FOR NEXT SESSION: review and perform HEP.  Cont with strengthening and ROM per pt and sx tolerance.   Leigh Minerva III PT, DPT 03/07/24 3:16 PM

## 2024-03-06 ENCOUNTER — Ambulatory Visit (HOSPITAL_BASED_OUTPATIENT_CLINIC_OR_DEPARTMENT_OTHER): Attending: Family Medicine | Admitting: Physical Therapy

## 2024-03-06 DIAGNOSIS — M6281 Muscle weakness (generalized): Secondary | ICD-10-CM | POA: Insufficient documentation

## 2024-03-06 DIAGNOSIS — M25561 Pain in right knee: Secondary | ICD-10-CM | POA: Diagnosis present

## 2024-03-06 DIAGNOSIS — M25661 Stiffness of right knee, not elsewhere classified: Secondary | ICD-10-CM | POA: Insufficient documentation

## 2024-03-07 ENCOUNTER — Encounter (HOSPITAL_BASED_OUTPATIENT_CLINIC_OR_DEPARTMENT_OTHER): Payer: Self-pay | Admitting: Physical Therapy

## 2024-03-08 NOTE — Therapy (Signed)
 OUTPATIENT PHYSICAL THERAPY LOWER EXTREMITY TREATMENT   Patient Name: Tracy Burns MRN: [id number] DOB:[birth year], 68 y.o., female Today's Date: 03/11/2024  END OF SESSION:  PT End of Session - 03/10/24 0808     Visit Number 2    Number of Visits 8    Date for PT Re-Evaluation 04/17/24    Authorization Type MCR A&B    PT Start Time 0807    PT Stop Time 0846    PT Time Calculation (min) 39 min    Activity Tolerance Patient tolerated treatment well    Behavior During Therapy [facility] for tasks assessed/performed           Past Medical History:  Diagnosis Date   PMS (premenstrual syndrome)    Past Surgical History:  Procedure Laterality Date   APPENDECTOMY     Patient Active Problem List   Diagnosis Date Noted   Allergic rhinitis 12/27/2016   Menopausal and postmenopausal disorder 03/22/2007   PAIN IN JOINT, UPPER ARM 01/18/2006    PCP: Waylan Almarie SAUNDERS, MD  REFERRING PROVIDER: Waylan Almarie SAUNDERS, MD  REFERRING DIAG: [phone] (ICD-10-CM) - Unspecified injury of right lower leg, initial encounter  M25.561 (ICD-10-CM) - Pain in right knee  THERAPY DIAG:  Right knee pain, unspecified chronicity  Muscle weakness (generalized)  Stiffness of right knee, not elsewhere classified  Rationale for Evaluation and Treatment: Rehabilitation  ONSET DATE: March 2025  SUBJECTIVE:   SUBJECTIVE STATEMENT: Pt states It's feeling better.  Pt reports she has been performing her exercises.  Pt denies any adverse effects after prior Rx.    Pt has pain with prolonged ambulation.  Pt has difficulty with ambulating on uneven terrain.  Pt is able to perform her normal household chores.  She has fatigue with stairs.  Pt can have pain if she sleeps with her knees in a significantly bent position.   Pt is a [employer] member.  PERTINENT HISTORY: Arthritis Osteoporosis  PAIN:  NPRS:  0/10 current, 3-4/10 worst Location:  anteromedial R knee  PRECAUTIONS: Other:  osteoporosis    WEIGHT BEARING RESTRICTIONS: No  FALLS:  Has patient fallen in last 6 months? No  LIVING ENVIRONMENT: Lives with: mother lives with her Lives in: home Stairs: yes Has following equipment at home: stair lift, cane, FWW, rollator, w/c  OCCUPATION: Retired  PLOF: Independent  PATIENT GOALS: improve flexibility and strength   OBJECTIVE:  Note: Objective measures were completed at Evaluation unless otherwise noted.  DIAGNOSTIC FINDINGS: None   LOWER EXTREMITY ROM:   ROM Right eval Left eval  Hip flexion    Hip extension    Hip abduction    Hip adduction    Hip internal rotation    Hip external rotation    Knee flexion 113/119 129  Knee extension 3/1 deg AROM/PROM felt uncomfortable 4 deg of hyperextension  Ankle dorsiflexion    Ankle plantarflexion    Ankle inversion    Ankle eversion     (Blank rows = not tested)  LOWER EXTREMITY MMT:  MMT Right eval Left eval  Hip flexion 5/5 4+/5  Hip extension    Hip abduction 4/5 4+/5  Hip adduction    Hip internal rotation    Hip external rotation 4/5 5/5  Knee flexion 5/5 seated 5/5 seated   Knee extension 4-/5 4/5  Ankle dorsiflexion    Ankle plantarflexion    Ankle inversion    Ankle eversion     (Blank rows = not tested)   FUNCTIONAL TESTS:  5x STS test:    GAIT: Assistive device utilized: None Level of assistance: Complete Independence Comments: WFL, heel to toe gait                                                                                                                                TREATMENT:   Reviewed pain level, HEP compliance, and response to prior Rx.    Reviewed and performed HEP. Pt performed: supine heel slides x 10 reps supine bridge 2x10 reps Supine SLR 2x10 S/L hip abd 2x10 LAQ 2x10 Seated clams with GTB 2x10 Step ups 6 inch 2x10 Longsitting gastroc stretch with strap 3x20 sec   PT updated HEP.  Pt received a HEP handout and was educated in correct  form and appropriate frequency.     PATIENT EDUCATION:  Education details:  PT answered pt's questions.  dx, relevant anatomy, POC, HEP, and rationale of interventions.   Person educated: Patient Education method: Explanation, Demonstration, Tactile cues, Verbal cues, and Handouts Education comprehension: verbalized understanding, returned demonstration, verbal cues required, tactile cues required, and needs further education  HOME EXERCISE PROGRAM: Access Code: [email or web address] URL: [email or web address] Date: 03/06/2024 Prepared by: Mose Minerva  Exercises - Supine Heel Slide  - 2 x daily - 7 x weekly - 2 sets - 10 reps - Supine Bridge  - 1 x daily - 7 x weekly - 2 sets - 10 reps  Updated HEP: - Supine Active Straight Leg Raise  - 1 x daily - 7 x weekly - 2 sets - 10 reps - Sidelying Hip Abduction  - 1 x daily - 7 x weekly - 2 sets - 10 reps - Seated Long Arc Quad  - 1 x daily - 7 x weekly - 2 sets - 10 reps  ASSESSMENT:  CLINICAL IMPRESSION: Pt reports she is doing better.  PT reviewed and performed HEP.  Pt performed exercises well with cuing and instruction in correct form without c/o's.  She had good tolerance with exercises.  PT updated HEP and gave pt a HEP handout.  Pt responded well to treatment reporting no pain after treatment.  Pt should benefit from skilled PT to address impairments and improve overall function.    OBJECTIVE IMPAIRMENTS: decreased activity tolerance, decreased mobility, difficulty walking, decreased ROM, decreased strength, hypomobility, increased edema, and pain.   ACTIVITY LIMITATIONS: sleeping, stairs, and locomotion level  PARTICIPATION LIMITATIONS: community activity  PERSONAL FACTORS: 1-2 comorbidities: arthritis and osteoporosis are also affecting patient's functional outcome.   REHAB POTENTIAL: Good  CLINICAL DECISION MAKING: Stable/uncomplicated  EVALUATION COMPLEXITY: Low   GOALS:   SHORT TERM GOALS: Target date:  03/27/24  Pt will be independent and compliant with HEP for improved pain, ROM, strength, and function.   Baseline: Goal status: INITIAL  2.  Pt will demo improved R knee AROM to 0 - 120 deg for improved mobility and stiffness.  Baseline:  Goal status: INITIAL    LONG  TERM GOALS: Target date: 04/17/2024  Pt will be able to ambulate on uneven terrain without increased pain or significant difficulty.   Baseline:  Goal status: INITIAL  2.  Pt will demo improved R hip and knee strength to 5/5 MMT for improved performance of and tolerance with functional mobility.  Baseline:  Goal status: INITIAL  3.  Pt will report at least a 70% improvement in pain and sx's overall.  Baseline:  Goal status: INITIAL  4.  Pt will report she is able to perform stairs without difficulty and demonstrate a reciprocal gait with the rail.   Baseline:  Goal status: INITIAL  5.  Pt will report she is able to ambulate extended community distance without increased pain.  Baseline:  Goal status: INITIAL    PLAN:  PT FREQUENCY: 1-2x/wk  PT DURATION: 6 weeks  PLANNED INTERVENTIONS: 97164- PT Re-evaluation, 97750- Physical Performance Testing, 97110-Therapeutic exercises, 97530- Therapeutic activity, [id number]- Neuromuscular re-education, 97535- Self Care, 02859- Manual therapy, [id number]- Gait training, [phone]- Aquatic Therapy, [phone]- Electrical stimulation (unattended), [id number]- Electrical stimulation (manual), [id number]- Ultrasound, Patient/Family education, Balance training, Stair training, Taping, Cryotherapy, and Moist heat  PLAN FOR NEXT SESSION: review and perform HEP.  Cont with strengthening and ROM per pt and sx tolerance.   Leigh Minerva III PT, DPT 03/11/24 3:36 PM

## 2024-03-10 ENCOUNTER — Encounter (HOSPITAL_BASED_OUTPATIENT_CLINIC_OR_DEPARTMENT_OTHER): Payer: Self-pay | Admitting: Physical Therapy

## 2024-03-10 ENCOUNTER — Ambulatory Visit (HOSPITAL_BASED_OUTPATIENT_CLINIC_OR_DEPARTMENT_OTHER): Admitting: Physical Therapy

## 2024-03-10 DIAGNOSIS — M25561 Pain in right knee: Secondary | ICD-10-CM

## 2024-03-10 DIAGNOSIS — M25661 Stiffness of right knee, not elsewhere classified: Secondary | ICD-10-CM

## 2024-03-10 DIAGNOSIS — M6281 Muscle weakness (generalized): Secondary | ICD-10-CM

## 2024-03-18 ENCOUNTER — Ambulatory Visit (HOSPITAL_BASED_OUTPATIENT_CLINIC_OR_DEPARTMENT_OTHER): Attending: Family Medicine | Admitting: Physical Therapy

## 2024-03-18 ENCOUNTER — Encounter (HOSPITAL_BASED_OUTPATIENT_CLINIC_OR_DEPARTMENT_OTHER): Payer: Self-pay | Admitting: Physical Therapy

## 2024-03-18 DIAGNOSIS — M25561 Pain in right knee: Secondary | ICD-10-CM | POA: Diagnosis present

## 2024-03-18 DIAGNOSIS — M6281 Muscle weakness (generalized): Secondary | ICD-10-CM | POA: Insufficient documentation

## 2024-03-18 DIAGNOSIS — M25661 Stiffness of right knee, not elsewhere classified: Secondary | ICD-10-CM | POA: Diagnosis present

## 2024-03-18 NOTE — Therapy (Signed)
 OUTPATIENT PHYSICAL THERAPY LOWER EXTREMITY TREATMENT   Patient Name: Tracy Burns MRN: [id number] DOB:09/23/1955, 68 y.o., female Today's Date: 03/18/2024  END OF SESSION:  PT End of Session - 03/18/24 1036     Visit Number 3    Number of Visits 8    Date for PT Re-Evaluation 04/17/24    Authorization Type MCR A&B    PT Start Time 1016    PT Stop Time 1055    PT Time Calculation (min) 39 min    Activity Tolerance Patient tolerated treatment well    Behavior During Therapy [facility] for tasks assessed/performed            Past Medical History:  Diagnosis Date   PMS (premenstrual syndrome)    Past Surgical History:  Procedure Laterality Date   APPENDECTOMY     Patient Active Problem List   Diagnosis Date Noted   Allergic rhinitis 12/27/2016   Menopausal and postmenopausal disorder 03/22/2007   PAIN IN JOINT, UPPER ARM 01/18/2006    PCP: Waylan Almarie SAUNDERS, MD  REFERRING PROVIDER: Waylan Almarie SAUNDERS, MD  REFERRING DIAG: [phone] (ICD-10-CM) - Unspecified injury of right lower leg, initial encounter  M25.561 (ICD-10-CM) - Pain in right knee  THERAPY DIAG:  Right knee pain, unspecified chronicity  Muscle weakness (generalized)  Stiffness of right knee, not elsewhere classified  Rationale for Evaluation and Treatment: Rehabilitation  ONSET DATE: March 2025  SUBJECTIVE:   SUBJECTIVE STATEMENT:  Knees are feeling better, not sure what's helping the most. Seems like inflammation is going away and issues are clearing up. There's still a little twinge once in awhile. Walking out in the yard is starting to feel more natural.    EVAL: Pt has pain with prolonged ambulation.  Pt has difficulty with ambulating on uneven terrain.  Pt is able to perform her normal household chores.  She has fatigue with stairs.  Pt can have pain if she sleeps with her knees in a significantly bent position.   Pt is a [employer] member.  PERTINENT HISTORY: Arthritis Osteoporosis  PAIN:   NPRS:  0/10 current Location:  anteromedial R knee  PRECAUTIONS: Other: osteoporosis    WEIGHT BEARING RESTRICTIONS: No  FALLS:  Has patient fallen in last 6 months? No  LIVING ENVIRONMENT: Lives with: mother lives with her Lives in: home Stairs: yes Has following equipment at home: stair lift, cane, FWW, rollator, w/c  OCCUPATION: Retired  PLOF: Independent  PATIENT GOALS: improve flexibility and strength   OBJECTIVE:  Note: Objective measures were completed at Evaluation unless otherwise noted.  DIAGNOSTIC FINDINGS: None   LOWER EXTREMITY ROM:   ROM Right eval Left eval Right 03/18/24 Left 03/18/24  Hip flexion      Hip extension      Hip abduction      Hip adduction      Hip internal rotation      Hip external rotation      Knee flexion 113/119 129 129* 126*  Knee extension 3/1 deg AROM/PROM felt uncomfortable 4 deg of hyperextension -2* 2* more stiff today   Ankle dorsiflexion      Ankle plantarflexion      Ankle inversion      Ankle eversion       (Blank rows = not tested)  LOWER EXTREMITY MMT:  MMT Right eval Left eval  Hip flexion 5/5 4+/5  Hip extension    Hip abduction 4/5 4+/5  Hip adduction    Hip internal rotation  Hip external rotation 4/5 5/5  Knee flexion 5/5 seated 5/5 seated   Knee extension 4-/5 4/5  Ankle dorsiflexion    Ankle plantarflexion    Ankle inversion    Ankle eversion     (Blank rows = not tested)   FUNCTIONAL TESTS:  5x STS test:    GAIT: Assistive device utilized: None Level of assistance: Complete Independence Comments: WFL, heel to toe gait                                                                                                                                TREATMENT:    03/18/24  Nustep L4x8 minutes BLEs only for w/u  ROM check/goals   SAQs 2.5# 2x12 B Bridges x15 SLRs 2.5# x12 B SLRs + ER 2.5# x12 B Sidelying clams red TB x12 B STS with red TB around knees x10 Standing hip ABD red  TB 2x12 B Standing hip flexion red TB 2x12 B Standing hip extensions red TB 2x12 B Hip hikes 2x12 B Hip hikes + ABD x12 B Seated HS stretch 1x30 seconds B  Seated figure 4 stretch 2x30 seconds B      PATIENT EDUCATION:  Education details:  PT answered pt's questions.  dx, relevant anatomy, POC, HEP, and rationale of interventions.   Person educated: Patient Education method: Explanation, Demonstration, Tactile cues, Verbal cues, and Handouts Education comprehension: verbalized understanding, returned demonstration, verbal cues required, tactile cues required, and needs further education  HOME EXERCISE PROGRAM: Access Code: [email or web address] URL: [email or web address] Date: 03/06/2024 Prepared by: Mose Minerva  Exercises - Supine Heel Slide  - 2 x daily - 7 x weekly - 2 sets - 10 reps - Supine Bridge  - 1 x daily - 7 x weekly - 2 sets - 10 reps  Updated HEP: - Supine Active Straight Leg Raise  - 1 x daily - 7 x weekly - 2 sets - 10 reps - Sidelying Hip Abduction  - 1 x daily - 7 x weekly - 2 sets - 10 reps - Seated Long Arc Quad  - 1 x daily - 7 x weekly - 2 sets - 10 reps  ASSESSMENT:  CLINICAL IMPRESSION:  Arrives today doing a bit better, she feels she is getting better. Continued working on functional strengthening with PREs as tolerated this visit. Updated ROM measures as well, she seems to be making steady progress.     EVAL: Pt reports she is doing better.  PT reviewed and performed HEP.  Pt performed exercises well with cuing and instruction in correct form without c/o's.  She had good tolerance with exercises.  PT updated HEP and gave pt a HEP handout.  Pt responded well to treatment reporting no pain after treatment.  Pt should benefit from skilled PT to address impairments and improve overall function.    OBJECTIVE IMPAIRMENTS: decreased activity tolerance, decreased mobility, difficulty walking, decreased ROM, decreased strength, hypomobility, increased  edema, and  pain.   ACTIVITY LIMITATIONS: sleeping, stairs, and locomotion level  PARTICIPATION LIMITATIONS: community activity  PERSONAL FACTORS: 1-2 comorbidities: arthritis and osteoporosis are also affecting patient's functional outcome.   REHAB POTENTIAL: Good  CLINICAL DECISION MAKING: Stable/uncomplicated  EVALUATION COMPLEXITY: Low   GOALS:   SHORT TERM GOALS: Target date: 03/27/24  Pt will be independent and compliant with HEP for improved pain, ROM, strength, and function.   Baseline: Goal status: ONGOING 03/18/24  2.  Pt will demo improved R knee AROM to 0 - 120 deg for improved mobility and stiffness.  Baseline:  Goal status: MET 03/18/24    LONG TERM GOALS: Target date: 04/17/2024  Pt will be able to ambulate on uneven terrain without increased pain or significant difficulty.   Baseline:  Goal status: INITIAL  2.  Pt will demo improved R hip and knee strength to 5/5 MMT for improved performance of and tolerance with functional mobility.  Baseline:  Goal status: INITIAL  3.  Pt will report at least a 70% improvement in pain and sx's overall.  Baseline:  Goal status: INITIAL  4.  Pt will report she is able to perform stairs without difficulty and demonstrate a reciprocal gait with the rail.   Baseline:  Goal status: INITIAL  5.  Pt will report she is able to ambulate extended community distance without increased pain.  Baseline:  Goal status: INITIAL    PLAN:  PT FREQUENCY: 1-2x/wk  PT DURATION: 6 weeks  PLANNED INTERVENTIONS: 97164- PT Re-evaluation, 97750- Physical Performance Testing, 97110-Therapeutic exercises, 97530- Therapeutic activity, [id number]- Neuromuscular re-education, 97535- Self Care, 02859- Manual therapy, [id number]- Gait training, [phone]- Aquatic Therapy, [phone]- Electrical stimulation (unattended), [id number]- Electrical stimulation (manual), [id number]- Ultrasound, Patient/Family education, Balance training, Stair training, Taping, Cryotherapy, and  Moist heat  PLAN FOR NEXT SESSION: review and perform HEP.  Cont with strengthening and ROM per pt and sx tolerance. PRE progressions as tolerated, how did she feel after more intense session?    Josette Rough, PT, DPT 03/18/24 10:55 AM

## 2024-03-25 ENCOUNTER — Ambulatory Visit (HOSPITAL_BASED_OUTPATIENT_CLINIC_OR_DEPARTMENT_OTHER): Admitting: Physical Therapy

## 2024-03-25 ENCOUNTER — Encounter (HOSPITAL_BASED_OUTPATIENT_CLINIC_OR_DEPARTMENT_OTHER): Payer: Self-pay | Admitting: Physical Therapy

## 2024-03-25 DIAGNOSIS — M25561 Pain in right knee: Secondary | ICD-10-CM

## 2024-03-25 DIAGNOSIS — M25661 Stiffness of right knee, not elsewhere classified: Secondary | ICD-10-CM

## 2024-03-25 DIAGNOSIS — M6281 Muscle weakness (generalized): Secondary | ICD-10-CM

## 2024-03-25 NOTE — Therapy (Signed)
 OUTPATIENT PHYSICAL THERAPY LOWER EXTREMITY TREATMENT   Patient Name: Tracy Burns MRN: [id number] DOB:[birth year], 68 y.o., female Today's Date: 03/25/2024  END OF SESSION:  PT End of Session - 03/25/24 1155     Visit Number 4    Number of Visits 8    Date for PT Re-Evaluation 04/17/24    Authorization Type MCR A&B    PT Start Time 1146    PT Stop Time 1225    PT Time Calculation (min) 39 min    Activity Tolerance Patient tolerated treatment well    Behavior During Therapy [facility] for tasks assessed/performed             Past Medical History:  Diagnosis Date   PMS (premenstrual syndrome)    Past Surgical History:  Procedure Laterality Date   APPENDECTOMY     Patient Active Problem List   Diagnosis Date Noted   Allergic rhinitis 12/27/2016   Menopausal and postmenopausal disorder 03/22/2007   PAIN IN JOINT, UPPER ARM 01/18/2006    PCP: Waylan Almarie SAUNDERS, MD  REFERRING PROVIDER: Waylan Almarie SAUNDERS, MD  REFERRING DIAG: [phone] (ICD-10-CM) - Unspecified injury of right lower leg, initial encounter  M25.561 (ICD-10-CM) - Pain in right knee  THERAPY DIAG:  Right knee pain, unspecified chronicity  Muscle weakness (generalized)  Stiffness of right knee, not elsewhere classified  Rationale for Evaluation and Treatment: Rehabilitation  ONSET DATE: March 2025  SUBJECTIVE:   SUBJECTIVE STATEMENT:  I was good until Thursday or Friday last week, then it started feeling more stiff. Don't remember doing anything weird. Feels a little swollen now. Felt good after last PT session, I was a little sore but not too bad. Getting better but would still like to get knee stronger   EVAL: Pt has pain with prolonged ambulation.  Pt has difficulty with ambulating on uneven terrain.  Pt is able to perform her normal household chores.  She has fatigue with stairs.  Pt can have pain if she sleeps with her knees in a significantly bent position.   Pt is a [employer]  member.  PERTINENT HISTORY: Arthritis Osteoporosis  PAIN:  NPRS:  0/10 today, just stiff    PRECAUTIONS: Other: osteoporosis    WEIGHT BEARING RESTRICTIONS: No  FALLS:  Has patient fallen in last 6 months? No  LIVING ENVIRONMENT: Lives with: mother lives with her Lives in: home Stairs: yes Has following equipment at home: stair lift, cane, FWW, rollator, w/c  OCCUPATION: Retired  PLOF: Independent  PATIENT GOALS: improve flexibility and strength   OBJECTIVE:  Note: Objective measures were completed at Evaluation unless otherwise noted.  DIAGNOSTIC FINDINGS: None   LOWER EXTREMITY ROM:   ROM Right eval Left eval Right 03/18/24 Left 03/18/24  Hip flexion      Hip extension      Hip abduction      Hip adduction      Hip internal rotation      Hip external rotation      Knee flexion 113/119 129 129* 126*  Knee extension 3/1 deg AROM/PROM felt uncomfortable 4 deg of hyperextension -2* 2* more stiff today   Ankle dorsiflexion      Ankle plantarflexion      Ankle inversion      Ankle eversion       (Blank rows = not tested)  LOWER EXTREMITY MMT:  MMT Right eval Left eval  Hip flexion 5/5 4+/5  Hip extension    Hip abduction 4/5 4+/5  Hip adduction  Hip internal rotation    Hip external rotation 4/5 5/5  Knee flexion 5/5 seated 5/5 seated   Knee extension 4-/5 4/5  Ankle dorsiflexion    Ankle plantarflexion    Ankle inversion    Ankle eversion     (Blank rows = not tested)   FUNCTIONAL TESTS:  5x STS test:    GAIT: Assistive device utilized: None Level of assistance: Complete Independence Comments: WFL, heel to toe gait                                                                                                                                TREATMENT:    03/25/24  Nustep L5x8 minutes BLEs only seat 7 Goals  Bridges + ABD into red TB x12 Sidelying hip ABD red TB x12 B Walking bridges x10 STS red band above knees x12  Standing  hip ABD red TB x12 B Standing hip extensions red TB x12 B Hip hikes x15 B Hip hike + swing x10 B  Shuttle LE press 75# double leg x12   Tandem stance blue foam 3x30 seconds B Forward/backward walking solid surface x3 laps at bar      03/18/24  Nustep L4x8 minutes BLEs only for w/u  ROM check/goals   SAQs 2.5# 2x12 B Bridges x15 SLRs 2.5# x12 B SLRs + ER 2.5# x12 B Sidelying clams red TB x12 B STS with red TB around knees x10 Standing hip ABD red TB 2x12 B Standing hip flexion red TB 2x12 B Standing hip extensions red TB 2x12 B Hip hikes 2x12 B Hip hikes + ABD x12 B Seated HS stretch 1x30 seconds B  Seated figure 4 stretch 2x30 seconds B      PATIENT EDUCATION:  Education details:  PT answered pt's questions.  dx, relevant anatomy, POC, HEP, and rationale of interventions.   Person educated: Patient Education method: Explanation, Demonstration, Tactile cues, Verbal cues, and Handouts Education comprehension: verbalized understanding, returned demonstration, verbal cues required, tactile cues required, and needs further education  HOME EXERCISE PROGRAM:  Access Code: [email or web address] URL: [email or web address] Date: 03/25/2024 Prepared by: Josette Rough  Exercises - Supine Heel Slide  - 2 x daily - 7 x weekly - 2 sets - 10 reps - Supine Active Straight Leg Raise  - 1 x daily - 7 x weekly - 2 sets - 10 reps - Seated Long Arc Quad  - 1 x daily - 7 x weekly - 2 sets - 10 reps - Sidelying Hip Abduction and Extension with Loop Band  - 1 x daily - 7 x weekly - 1-2 sets - 10 reps - Supine Bridge with Resistance Band  - 1 x daily - 7 x weekly - 1-2 sets - 10 reps - Bridge Walk Out  - 1 x daily - 7 x weekly - 1-2 sets - 7 reps - Standing Hip Abduction with Resistance at Thighs  - 1 x daily - 7  x weekly - 1-2 sets - 10 reps - Standing Hip Extension with Resistance at Ankles and Unilateral Counter Support  - 1 x daily - 7 x weekly - 1-2 sets - 10  reps   ASSESSMENT:  CLINICAL IMPRESSION:  Arrives doing well today, felt good after last visit and is making good progress towards LTGs. We continued working on progressions of functional strength this morning, she did well with all interventions. Will continue to challenge her and work towards goals yet unmet.     EVAL: Pt reports she is doing better.  PT reviewed and performed HEP.  Pt performed exercises well with cuing and instruction in correct form without c/o's.  She had good tolerance with exercises.  PT updated HEP and gave pt a HEP handout.  Pt responded well to treatment reporting no pain after treatment.  Pt should benefit from skilled PT to address impairments and improve overall function.    OBJECTIVE IMPAIRMENTS: decreased activity tolerance, decreased mobility, difficulty walking, decreased ROM, decreased strength, hypomobility, increased edema, and pain.   ACTIVITY LIMITATIONS: sleeping, stairs, and locomotion level  PARTICIPATION LIMITATIONS: community activity  PERSONAL FACTORS: 1-2 comorbidities: arthritis and osteoporosis are also affecting patient's functional outcome.   REHAB POTENTIAL: Good  CLINICAL DECISION MAKING: Stable/uncomplicated  EVALUATION COMPLEXITY: Low   GOALS:   SHORT TERM GOALS: Target date: 03/27/24  Pt will be independent and compliant with HEP for improved pain, ROM, strength, and function.   Baseline: Goal status: ONGOING 03/18/24  2.  Pt will demo improved R knee AROM to 0 - 120 deg for improved mobility and stiffness.  Baseline:  Goal status: MET 03/18/24    LONG TERM GOALS: Target date: 04/17/2024  Pt will be able to ambulate on uneven terrain without increased pain or significant difficulty.   Baseline:  Goal status: INITIAL  2.  Pt will demo improved R hip and knee strength to 5/5 MMT for improved performance of and tolerance with functional mobility.  Baseline:  Goal status: INITIAL  3.  Pt will report at least a 70%  improvement in pain and sx's overall.  Baseline:  Goal status: MET 03/25/24  4.  Pt will report she is able to perform stairs without difficulty and demonstrate a reciprocal gait with the rail.   Baseline:  Goal status: PARTIALLY MET 03/25/24 takes stair lift first thing in AM until after she warms up, rest of the day no issue with steps   5.  Pt will report she is able to ambulate extended community distance without increased pain.  Baseline:  Goal status: MET 03/25/24    PLAN:  PT FREQUENCY: 1-2x/wk  PT DURATION: 6 weeks  PLANNED INTERVENTIONS: 97164- PT Re-evaluation, 97750- Physical Performance Testing, 97110-Therapeutic exercises, 97530- Therapeutic activity, [id number]- Neuromuscular re-education, 97535- Self Care, [id number]- Manual therapy, [id number]- Gait training, [phone]- Aquatic Therapy, [phone]- Electrical stimulation (unattended), [id number]- Electrical stimulation (manual), [id number]- Ultrasound, Patient/Family education, Balance training, Stair training, Taping, Cryotherapy, and Moist heat  PLAN FOR NEXT SESSION: review and perform HEP.  Cont with strengthening and ROM per pt and sx tolerance. PRE progressions as tolerated   Josette Rough, PT, DPT 03/25/24 12:28 PM

## 2024-04-01 ENCOUNTER — Encounter (HOSPITAL_BASED_OUTPATIENT_CLINIC_OR_DEPARTMENT_OTHER): Payer: Self-pay | Admitting: Physical Therapy

## 2024-04-01 ENCOUNTER — Ambulatory Visit (HOSPITAL_BASED_OUTPATIENT_CLINIC_OR_DEPARTMENT_OTHER): Admitting: Physical Therapy

## 2024-04-01 DIAGNOSIS — M6281 Muscle weakness (generalized): Secondary | ICD-10-CM

## 2024-04-01 DIAGNOSIS — M25561 Pain in right knee: Secondary | ICD-10-CM | POA: Diagnosis not present

## 2024-04-01 DIAGNOSIS — M25661 Stiffness of right knee, not elsewhere classified: Secondary | ICD-10-CM

## 2024-04-01 NOTE — Therapy (Signed)
 OUTPATIENT PHYSICAL THERAPY LOWER EXTREMITY TREATMENT   Patient Name: Tracy Burns MRN: [id number] DOB:1955/10/06, 68 y.o., female Today's Date: 04/01/2024  END OF SESSION:  PT End of Session - 04/01/24 1037     Visit Number 5    Number of Visits 8    Date for PT Re-Evaluation 04/17/24    Authorization Type MCR A&B    Progress Note Due on Visit 10    PT Start Time 1016    PT Stop Time 1056    PT Time Calculation (min) 40 min    Activity Tolerance Patient tolerated treatment well    Behavior During Therapy [facility] for tasks assessed/performed              Past Medical History:  Diagnosis Date   PMS (premenstrual syndrome)    Past Surgical History:  Procedure Laterality Date   APPENDECTOMY     Patient Active Problem List   Diagnosis Date Noted   Allergic rhinitis 12/27/2016   Menopausal and postmenopausal disorder 03/22/2007   PAIN IN JOINT, UPPER ARM 01/18/2006    PCP: Waylan Almarie SAUNDERS, MD  REFERRING PROVIDER: Waylan Almarie SAUNDERS, MD  REFERRING DIAG: [phone] (ICD-10-CM) - Unspecified injury of right lower leg, initial encounter  M25.561 (ICD-10-CM) - Pain in right knee  THERAPY DIAG:  Right knee pain, unspecified chronicity  Muscle weakness (generalized)  Stiffness of right knee, not elsewhere classified  Rationale for Evaluation and Treatment: Rehabilitation  ONSET DATE: March 2025  SUBJECTIVE:   SUBJECTIVE STATEMENT:  Nothing really new, just dealing with a lot of stuff with the car and house   EVAL: Pt has pain with prolonged ambulation.  Pt has difficulty with ambulating on uneven terrain.  Pt is able to perform her normal household chores.  She has fatigue with stairs.  Pt can have pain if she sleeps with her knees in a significantly bent position.   Pt is a [employer] member.  PERTINENT HISTORY: Arthritis Osteoporosis  PAIN:  NPRS:  0/10 today, just stiff    PRECAUTIONS: Other: osteoporosis    WEIGHT BEARING RESTRICTIONS:  No  FALLS:  Has patient fallen in last 6 months? No  LIVING ENVIRONMENT: Lives with: mother lives with her Lives in: home Stairs: yes Has following equipment at home: stair lift, cane, FWW, rollator, w/c  OCCUPATION: Retired  PLOF: Independent  PATIENT GOALS: improve flexibility and strength   OBJECTIVE:  Note: Objective measures were completed at Evaluation unless otherwise noted.  DIAGNOSTIC FINDINGS: None   LOWER EXTREMITY ROM:   ROM Right eval Left eval Right 03/18/24 Left 03/18/24  Hip flexion      Hip extension      Hip abduction      Hip adduction      Hip internal rotation      Hip external rotation      Knee flexion 113/119 129 129* 126*  Knee extension 3/1 deg AROM/PROM felt uncomfortable 4 deg of hyperextension -2* 2* more stiff today   Ankle dorsiflexion      Ankle plantarflexion      Ankle inversion      Ankle eversion       (Blank rows = not tested)  LOWER EXTREMITY MMT:  MMT Right eval Left eval  Hip flexion 5/5 4+/5  Hip extension    Hip abduction 4/5 4+/5  Hip adduction    Hip internal rotation    Hip external rotation 4/5 5/5  Knee flexion 5/5 seated 5/5 seated   Knee extension  4-/5 4/5  Ankle dorsiflexion    Ankle plantarflexion    Ankle inversion    Ankle eversion     (Blank rows = not tested)   FUNCTIONAL TESTS:  5x STS test:    GAIT: Assistive device utilized: None Level of assistance: Complete Independence Comments: WFL, heel to toe gait                                                                                                                                TREATMENT:    04/01/24  Nustep L5x8 minutes BLEs only seat 7  Sidelying hip ABD green TB at ankles x10 B Prone hip extensions green TB at ankles x10 B Single leg bridges x10 B Shuttle press BLEs 100# 2x12 Shuttle press single legs 50# 1x10 B, 62# 1x10 B Forward step ups 6 inch box x12 B Lateral step ups 6 inch box x12 B  Tandem stance blue foam pad 3x30  seconds B  3 way taps off blue foam pad x10 B     03/25/24  Nustep L5x8 minutes BLEs only seat 7 Goals  Bridges + ABD into red TB x12 Sidelying hip ABD red TB x12 B Walking bridges x10 STS red band above knees x12  Standing hip ABD red TB x12 B Standing hip extensions red TB x12 B Hip hikes x15 B Hip hike + swing x10 B  Shuttle LE press 75# double leg x12   Tandem stance blue foam 3x30 seconds B Forward/backward walking solid surface x3 laps at bar     PATIENT EDUCATION:  Education details:  PT answered pt's questions.  dx, relevant anatomy, POC, HEP, and rationale of interventions.   Person educated: Patient Education method: Explanation, Demonstration, Tactile cues, Verbal cues, and Handouts Education comprehension: verbalized understanding, returned demonstration, verbal cues required, tactile cues required, and needs further education  HOME EXERCISE PROGRAM:  Access Code: [email or web address] URL: [email or web address] Date: 03/25/2024 Prepared by: Josette Rough  Exercises - Supine Heel Slide  - 2 x daily - 7 x weekly - 2 sets - 10 reps - Supine Active Straight Leg Raise  - 1 x daily - 7 x weekly - 2 sets - 10 reps - Seated Long Arc Quad  - 1 x daily - 7 x weekly - 2 sets - 10 reps - Sidelying Hip Abduction and Extension with Loop Band  - 1 x daily - 7 x weekly - 1-2 sets - 10 reps - Supine Bridge with Resistance Band  - 1 x daily - 7 x weekly - 1-2 sets - 10 reps - Bridge Walk Out  - 1 x daily - 7 x weekly - 1-2 sets - 7 reps - Standing Hip Abduction with Resistance at Thighs  - 1 x daily - 7 x weekly - 1-2 sets - 10 reps - Standing Hip Extension with Resistance at Ankles and Unilateral Counter Support  - 1 x daily - 7 x weekly -  1-2 sets - 10 reps   ASSESSMENT:  CLINICAL IMPRESSION:  Arrives doing well today, had a little extra pain over the weekend after a lot of activity but seems to have recovered well. Progressed all exercises today as tolerated, no  increased pain or discomfort noted. On track to meet goals yet unmet.    EVAL: Pt reports she is doing better.  PT reviewed and performed HEP.  Pt performed exercises well with cuing and instruction in correct form without c/o's.  She had good tolerance with exercises.  PT updated HEP and gave pt a HEP handout.  Pt responded well to treatment reporting no pain after treatment.  Pt should benefit from skilled PT to address impairments and improve overall function.    OBJECTIVE IMPAIRMENTS: decreased activity tolerance, decreased mobility, difficulty walking, decreased ROM, decreased strength, hypomobility, increased edema, and pain.   ACTIVITY LIMITATIONS: sleeping, stairs, and locomotion level  PARTICIPATION LIMITATIONS: community activity  PERSONAL FACTORS: 1-2 comorbidities: arthritis and osteoporosis are also affecting patient's functional outcome.   REHAB POTENTIAL: Good  CLINICAL DECISION MAKING: Stable/uncomplicated  EVALUATION COMPLEXITY: Low   GOALS:   SHORT TERM GOALS: Target date: 03/27/24  Pt will be independent and compliant with HEP for improved pain, ROM, strength, and function.   Baseline: Goal status: ONGOING 03/18/24  2.  Pt will demo improved R knee AROM to 0 - 120 deg for improved mobility and stiffness.  Baseline:  Goal status: MET 03/18/24    LONG TERM GOALS: Target date: 04/17/2024  Pt will be able to ambulate on uneven terrain without increased pain or significant difficulty.   Baseline:  Goal status: INITIAL  2.  Pt will demo improved R hip and knee strength to 5/5 MMT for improved performance of and tolerance with functional mobility.  Baseline:  Goal status: INITIAL  3.  Pt will report at least a 70% improvement in pain and sx's overall.  Baseline:  Goal status: MET 03/25/24  4.  Pt will report she is able to perform stairs without difficulty and demonstrate a reciprocal gait with the rail.   Baseline:  Goal status: PARTIALLY MET 03/25/24 takes stair  lift first thing in AM until after she warms up, rest of the day no issue with steps   5.  Pt will report she is able to ambulate extended community distance without increased pain.  Baseline:  Goal status: MET 03/25/24    PLAN:  PT FREQUENCY: 1-2x/wk  PT DURATION: 6 weeks  PLANNED INTERVENTIONS: 97164- PT Re-evaluation, 97750- Physical Performance Testing, 97110-Therapeutic exercises, 97530- Therapeutic activity, [id number]- Neuromuscular re-education, 97535- Self Care, 02859- Manual therapy, [id number]- Gait training, [phone]- Aquatic Therapy, [phone]- Electrical stimulation (unattended), [id number]- Electrical stimulation (manual), [id number]- Ultrasound, Patient/Family education, Balance training, Stair training, Taping, Cryotherapy, and Moist heat  PLAN FOR NEXT SESSION:  Cont with strengthening and ROM per pt and sx tolerance. PRE progressions as tolerated, progress balance    Josette Rough, PT, DPT 04/01/24 10:57 AM

## 2024-04-08 ENCOUNTER — Ambulatory Visit (HOSPITAL_BASED_OUTPATIENT_CLINIC_OR_DEPARTMENT_OTHER): Admitting: Physical Therapy

## 2024-04-08 ENCOUNTER — Encounter (HOSPITAL_BASED_OUTPATIENT_CLINIC_OR_DEPARTMENT_OTHER): Payer: Self-pay | Admitting: Physical Therapy

## 2024-04-08 DIAGNOSIS — M25661 Stiffness of right knee, not elsewhere classified: Secondary | ICD-10-CM

## 2024-04-08 DIAGNOSIS — M25561 Pain in right knee: Secondary | ICD-10-CM | POA: Diagnosis not present

## 2024-04-08 DIAGNOSIS — M6281 Muscle weakness (generalized): Secondary | ICD-10-CM

## 2024-04-08 NOTE — Therapy (Signed)
 OUTPATIENT PHYSICAL THERAPY LOWER EXTREMITY TREATMENT   Patient Name: Tracy Burns MRN: [id number] DOB:May 31, 1956, 68 y.o., female Today's Date: 04/08/2024  END OF SESSION:  PT End of Session - 04/08/24 1039     Visit Number 6    Number of Visits 8    Date for Recertification  04/17/24    Authorization Type MCR A&B    Progress Note Due on Visit 10    PT Start Time 1016    PT Stop Time 1056    PT Time Calculation (min) 40 min    Activity Tolerance Patient tolerated treatment well    Behavior During Therapy [facility] for tasks assessed/performed               Past Medical History:  Diagnosis Date   PMS (premenstrual syndrome)    Past Surgical History:  Procedure Laterality Date   APPENDECTOMY     Patient Active Problem List   Diagnosis Date Noted   Allergic rhinitis 12/27/2016   Menopausal and postmenopausal disorder 03/22/2007   PAIN IN JOINT, UPPER ARM 01/18/2006    PCP: Waylan Almarie SAUNDERS, MD  REFERRING PROVIDER: Waylan Almarie SAUNDERS, MD  REFERRING DIAG: [phone] (ICD-10-CM) - Unspecified injury of right lower leg, initial encounter  M25.561 (ICD-10-CM) - Pain in right knee  THERAPY DIAG:  Right knee pain, unspecified chronicity  Muscle weakness (generalized)  Stiffness of right knee, not elsewhere classified  Rationale for Evaluation and Treatment: Rehabilitation  ONSET DATE: March 2025  SUBJECTIVE:   SUBJECTIVE STATEMENT:  Doing well, nothing new this week.   EVAL: Pt has pain with prolonged ambulation.  Pt has difficulty with ambulating on uneven terrain.  Pt is able to perform her normal household chores.  She has fatigue with stairs.  Pt can have pain if she sleeps with her knees in a significantly bent position.   Pt is a [employer] member.  PERTINENT HISTORY: Arthritis Osteoporosis  PAIN:  NPRS:  0/10 just stiff today    PRECAUTIONS: Other: osteoporosis    WEIGHT BEARING RESTRICTIONS: No  FALLS:  Has patient fallen in last 6  months? No  LIVING ENVIRONMENT: Lives with: mother lives with her Lives in: home Stairs: yes Has following equipment at home: stair lift, cane, FWW, rollator, w/c  OCCUPATION: Retired  PLOF: Independent  PATIENT GOALS: improve flexibility and strength   OBJECTIVE:  Note: Objective measures were completed at Evaluation unless otherwise noted.  DIAGNOSTIC FINDINGS: None   LOWER EXTREMITY ROM:   ROM Right eval Left eval Right 03/18/24 Left 03/18/24  Hip flexion      Hip extension      Hip abduction      Hip adduction      Hip internal rotation      Hip external rotation      Knee flexion 113/119 129 129* 126*  Knee extension 3/1 deg AROM/PROM felt uncomfortable 4 deg of hyperextension -2* 2* more stiff today   Ankle dorsiflexion      Ankle plantarflexion      Ankle inversion      Ankle eversion       (Blank rows = not tested)  LOWER EXTREMITY MMT:  MMT Right eval Left eval Right 04/08/24 Left 04/08/24  Hip flexion 5/5 4+/5 4+ 4+  Hip extension      Hip abduction 4/5 4+/5 4+ 4+  Hip adduction      Hip internal rotation      Hip external rotation 4/5 5/5    Knee flexion  5/5 seated 5/5 seated     Knee extension 4-/5 4/5    Ankle dorsiflexion      Ankle plantarflexion      Ankle inversion      Ankle eversion       (Blank rows = not tested)   FUNCTIONAL TESTS:  5x STS test:    GAIT: Assistive device utilized: None Level of assistance: Complete Independence Comments: WFL, heel to toe gait                                                                                                                                TREATMENT:     04/08/24  Nustep L5x8 minutes seat 7 BLEs MMT, goals, discussed POC and DC next visit  Tandem stance blue foam pad 3x30 seconds Tandem walks forward/backward x3 laps 3 way taps blue foam pad x10 B SLS 3x30 seconds B solid surface  Tandem stance head turns solid surface 4 rounds     04/01/24  Nustep L5x8 minutes BLEs only  seat 7  Sidelying hip ABD green TB at ankles x10 B Prone hip extensions green TB at ankles x10 B Single leg bridges x10 B Shuttle press BLEs 100# 2x12 Shuttle press single legs 50# 1x10 B, 62# 1x10 B Forward step ups 6 inch box x12 B Lateral step ups 6 inch box x12 B  Tandem stance blue foam pad 3x30 seconds B  3 way taps off blue foam pad x10 B     03/25/24  Nustep L5x8 minutes BLEs only seat 7 Goals  Bridges + ABD into red TB x12 Sidelying hip ABD red TB x12 B Walking bridges x10 STS red band above knees x12  Standing hip ABD red TB x12 B Standing hip extensions red TB x12 B Hip hikes x15 B Hip hike + swing x10 B  Shuttle LE press 75# double leg x12   Tandem stance blue foam 3x30 seconds B Forward/backward walking solid surface x3 laps at bar     PATIENT EDUCATION:  Education details:  PT answered pt's questions.  dx, relevant anatomy, POC, HEP, and rationale of interventions.   Person educated: Patient Education method: Explanation, Demonstration, Tactile cues, Verbal cues, and Handouts Education comprehension: verbalized understanding, returned demonstration, verbal cues required, tactile cues required, and needs further education  HOME EXERCISE PROGRAM:  Access Code: [email or web address] URL: [email or web address] Date: 03/25/2024 Prepared by: Josette Rough  Exercises - Supine Heel Slide  - 2 x daily - 7 x weekly - 2 sets - 10 reps - Supine Active Straight Leg Raise  - 1 x daily - 7 x weekly - 2 sets - 10 reps - Seated Long Arc Quad  - 1 x daily - 7 x weekly - 2 sets - 10 reps - Sidelying Hip Abduction and Extension with Loop Band  - 1 x daily - 7 x weekly - 1-2 sets - 10 reps - Supine Bridge with Resistance Band  -  1 x daily - 7 x weekly - 1-2 sets - 10 reps - Bridge Walk Out  - 1 x daily - 7 x weekly - 1-2 sets - 7 reps - Standing Hip Abduction with Resistance at Thighs  - 1 x daily - 7 x weekly - 1-2 sets - 10 reps - Standing Hip Extension with  Resistance at Ankles and Unilateral Counter Support  - 1 x daily - 7 x weekly - 1-2 sets - 10 reps   ASSESSMENT:  CLINICAL IMPRESSION:  She is very pleased with how she is doing in PT- main concern is her balance so we focused on this more today. Updated HEP as appropriate today, she is agreeable to DC next time.    EVAL: Pt reports she is doing better.  PT reviewed and performed HEP.  Pt performed exercises well with cuing and instruction in correct form without c/o's.  She had good tolerance with exercises.  PT updated HEP and gave pt a HEP handout.  Pt responded well to treatment reporting no pain after treatment.  Pt should benefit from skilled PT to address impairments and improve overall function.    OBJECTIVE IMPAIRMENTS: decreased activity tolerance, decreased mobility, difficulty walking, decreased ROM, decreased strength, hypomobility, increased edema, and pain.   ACTIVITY LIMITATIONS: sleeping, stairs, and locomotion level  PARTICIPATION LIMITATIONS: community activity  PERSONAL FACTORS: 1-2 comorbidities: arthritis and osteoporosis are also affecting patient's functional outcome.   REHAB POTENTIAL: Good  CLINICAL DECISION MAKING: Stable/uncomplicated  EVALUATION COMPLEXITY: Low   GOALS:   SHORT TERM GOALS: Target date: 03/27/24  Pt will be independent and compliant with HEP for improved pain, ROM, strength, and function.   Baseline: Goal status: MET  04/08/24  2.  Pt will demo improved R knee AROM to 0 - 120 deg for improved mobility and stiffness.  Baseline:  Goal status: MET 03/18/24    LONG TERM GOALS: Target date: 04/17/2024  Pt will be able to ambulate on uneven terrain without increased pain or significant difficulty.   Baseline:  Goal status: MET 04/08/24 reports no difficulty   2.  Pt will demo improved R hip and knee strength to 5/5 MMT for improved performance of and tolerance with functional mobility.  Baseline:  Goal status: ONGOING 04/08/24  3.   Pt will report at least a 70% improvement in pain and sx's overall.  Baseline:  Goal status: MET 03/25/24  4.  Pt will report she is able to perform stairs without difficulty and demonstrate a reciprocal gait with the rail.   Baseline:  Goal status: MET 04/08/24   5.  Pt will report she is able to ambulate extended community distance without increased pain.  Baseline:  Goal status: MET 03/25/24    PLAN:  PT FREQUENCY: 1-2x/wk  PT DURATION: 6 weeks  PLANNED INTERVENTIONS: 97164- PT Re-evaluation, 97750- Physical Performance Testing, 97110-Therapeutic exercises, 97530- Therapeutic activity, [id number]- Neuromuscular re-education, 97535- Self Care, 02859- Manual therapy, [phone]- Gait training, [phone]- Aquatic Therapy, [phone]- Electrical stimulation (unattended), [phone]- Electrical stimulation (manual), 97035- Ultrasound, Patient/Family education, Balance training, Stair training, Taping, Cryotherapy, and Moist heat  PLAN FOR NEXT SESSION:  DC next visit     Josette Rough, PT, DPT 04/08/24 10:56 AM

## 2024-04-15 ENCOUNTER — Ambulatory Visit (HOSPITAL_BASED_OUTPATIENT_CLINIC_OR_DEPARTMENT_OTHER): Admitting: Physical Therapy

## 2024-04-15 DIAGNOSIS — M6281 Muscle weakness (generalized): Secondary | ICD-10-CM

## 2024-04-15 DIAGNOSIS — M25561 Pain in right knee: Secondary | ICD-10-CM

## 2024-04-15 DIAGNOSIS — M25661 Stiffness of right knee, not elsewhere classified: Secondary | ICD-10-CM

## 2024-04-15 NOTE — Therapy (Unsigned)
 OUTPATIENT PHYSICAL THERAPY LOWER EXTREMITY TREATMENT   Patient Name: Tracy Burns MRN: [id number] DOB:1956/05/11, 68 y.o., female Today's Date: 04/15/2024  END OF SESSION:         Past Medical History:  Diagnosis Date   PMS (premenstrual syndrome)    Past Surgical History:  Procedure Laterality Date   APPENDECTOMY     Patient Active Problem List   Diagnosis Date Noted   Allergic rhinitis 12/27/2016   Menopausal and postmenopausal disorder 03/22/2007   PAIN IN JOINT, UPPER ARM 01/18/2006    PCP: Waylan Almarie SAUNDERS, MD  REFERRING PROVIDER: Waylan Almarie SAUNDERS, MD  REFERRING DIAG: [id number] (ICD-10-CM) - Unspecified injury of right lower leg, initial encounter  M25.561 (ICD-10-CM) - Pain in right knee  THERAPY DIAG:  No diagnosis found.  Rationale for Evaluation and Treatment: Rehabilitation  ONSET DATE: March 2025  SUBJECTIVE:   SUBJECTIVE STATEMENT:  Pt states she had soreness one day last week, not pain, though realized she ambulated almost 9,000 steps that day.  Pt denies pain currently and states she has not been having pain.  Pt reports improved balance, but she feels like her walking is completely normal.    Pt is a [employer] member.  PERTINENT HISTORY: Arthritis Osteoporosis  PAIN:  NPRS:  0/10 just stiff today    PRECAUTIONS: Other: osteoporosis    WEIGHT BEARING RESTRICTIONS: No  FALLS:  Has patient fallen in last 6 months? No  LIVING ENVIRONMENT: Lives with: mother lives with her Lives in: home Stairs: yes Has following equipment at home: stair lift, cane, FWW, rollator, w/c  OCCUPATION: Retired  PLOF: Independent  PATIENT GOALS: improve flexibility and strength   OBJECTIVE:  Note: Objective measures were completed at Evaluation unless otherwise noted.  DIAGNOSTIC FINDINGS: None  PATIENT SURVEYS:  LEFS:  Initial/Current:  58/80 /  70/80   LOWER EXTREMITY ROM:   ROM Right eval Left eval Right 03/18/24 Left 03/18/24   Hip flexion      Hip extension      Hip abduction      Hip adduction      Hip internal rotation      Hip external rotation      Knee flexion 113/119 129 129* 126*  Knee extension 3/1 deg AROM/PROM felt uncomfortable 4 deg of hyperextension -2* 2* more stiff today   Ankle dorsiflexion      Ankle plantarflexion      Ankle inversion      Ankle eversion       (Blank rows = not tested)  LOWER EXTREMITY MMT:  MMT Right eval Left eval Right 04/08/24 Left 04/08/24 Right 9/30 Left 9/30  Hip flexion 5/5 4+/5 4+ 4+ 5/5   Hip extension        Hip abduction 4/5 4+/5 4+ 4+ 4+/5   Hip adduction        Hip internal rotation        Hip external rotation 4/5 5/5   5/5   Knee flexion 5/5 seated 5/5 seated       Knee extension 4-/5 4/5   5/5 5/5  Ankle dorsiflexion        Ankle plantarflexion        Ankle inversion        Ankle eversion         (Blank rows = not tested)   FUNCTIONAL TESTS:  5x STS test:    GAIT: Assistive device utilized: None Level of assistance: Complete Independence Comments: WFL, heel to toe gait  TREATMENT:    04/15/24 Nustep lvl 5 x 6 mins Assessed LE strength Reviewed HEP.  Supine bridge with GTB around LE's x 10 reps Supine SL bridge 2x10 Tandem gait fwd and bwd beside rail SLS 3x10-16 sec Tandem stance head turns 10 reps each Shuttle press single legs  2-25's and 1-12, 2x10 bilat   04/08/24  Nustep L5x8 minutes seat 7 BLEs MMT, goals, discussed POC and DC next visit  Tandem stance blue foam pad 3x30 seconds Tandem walks forward/backward x3 laps 3 way taps blue foam pad x10 B SLS 3x30 seconds B solid surface  Tandem stance head turns solid surface 4 rounds     04/01/24  Nustep L5x8 minutes BLEs only seat 7  Sidelying hip ABD green TB at ankles x10 B Prone hip extensions green TB at ankles x10 B Single leg  bridges x10 B Shuttle press BLEs 100# 2x12 Shuttle press single legs 50# 1x10 B, 62# 1x10 B Forward step ups 6 inch box x12 B Lateral step ups 6 inch box x12 B  Tandem stance blue foam pad 3x30 seconds B  3 way taps off blue foam pad x10 B     03/25/24  Nustep L5x8 minutes BLEs only seat 7 Goals  Bridges + ABD into red TB x12 Sidelying hip ABD red TB x12 B Walking bridges x10 STS red band above knees x12  Standing hip ABD red TB x12 B Standing hip extensions red TB x12 B Hip hikes x15 B Hip hike + swing x10 B  Shuttle LE press 75# double leg x12   Tandem stance blue foam 3x30 seconds B Forward/backward walking solid surface x3 laps at bar     PATIENT EDUCATION:  Education details:  PT answered pt's questions.  dx, relevant anatomy, POC, HEP, and rationale of interventions.   Person educated: Patient Education method: Explanation, Demonstration, Tactile cues, Verbal cues, and Handouts Education comprehension: verbalized understanding, returned demonstration, verbal cues required, tactile cues required, and needs further education  HOME EXERCISE PROGRAM:  Access Code: [email or web address] URL: [email or web address] Date: 03/25/2024 Prepared by: Josette Rough  Exercises - Supine Heel Slide  - 2 x daily - 7 x weekly - 2 sets - 10 reps - Supine Active Straight Leg Raise  - 1 x daily - 7 x weekly - 2 sets - 10 reps - Seated Long Arc Quad  - 1 x daily - 7 x weekly - 2 sets - 10 reps - Sidelying Hip Abduction and Extension with Loop Band  - 1 x daily - 7 x weekly - 1-2 sets - 10 reps - Supine Bridge with Resistance Band  - 1 x daily - 7 x weekly - 1-2 sets - 10 reps - Bridge Walk Out  - 1 x daily - 7 x weekly - 1-2 sets - 7 reps - Standing Hip Abduction with Resistance at Thighs  - 1 x daily - 7 x weekly - 1-2 sets - 10 reps - Standing Hip Extension with Resistance at Ankles and Unilateral Counter Support  - 1 x daily - 7 x weekly - 1-2 sets - 10 reps  Updated  HEP: - Figure 4 Bridge  - 1 x daily - 3-4 x weekly - 2 sets - 10 reps   ASSESSMENT:  CLINICAL IMPRESSION:  She is very pleased with how she is doing in PT- main concern is her balance so we focused on this more today. Updated HEP as appropriate today, she is agreeable to DC next time.  Pt demonstrates improved strength in R hip and knee and has 5/5 MMT LEFS:  Initial/Current:  58/80 /  70/80   EVAL: Pt reports she is doing better.  PT reviewed and performed HEP.  Pt performed exercises well with cuing and instruction in correct form without c/o's.  She had good tolerance with exercises.  PT updated HEP and gave pt a HEP handout.  Pt responded well to treatment reporting no pain after treatment.  Pt should benefit from skilled PT to address impairments and improve overall function.    OBJECTIVE IMPAIRMENTS: decreased activity tolerance, decreased mobility, difficulty walking, decreased ROM, decreased strength, hypomobility, increased edema, and pain.   ACTIVITY LIMITATIONS: sleeping, stairs, and locomotion level  PARTICIPATION LIMITATIONS: community activity  PERSONAL FACTORS: 1-2 comorbidities: arthritis and osteoporosis are also affecting patient's functional outcome.   REHAB POTENTIAL: Good  CLINICAL DECISION MAKING: Stable/uncomplicated  EVALUATION COMPLEXITY: Low   GOALS:   SHORT TERM GOALS: Target date: 03/27/24  Pt will be independent and compliant with HEP for improved pain, ROM, strength, and function.   Baseline: Goal status: MET  04/08/24  2.  Pt will demo improved R knee AROM to 0 - 120 deg for improved mobility and stiffness.  Baseline:  Goal status: MET 03/18/24    LONG TERM GOALS: Target date: 04/17/2024  Pt will be able to ambulate on uneven terrain without increased pain or significant difficulty.   Baseline:  Goal status: MET 04/08/24 reports no difficulty   2.  Pt will demo improved R hip and knee strength to 5/5 MMT for improved performance of and  tolerance with functional mobility.  Baseline:  Goal status: ONGOING 04/08/24  3.  Pt will report at least a 70% improvement in pain and sx's overall.  Baseline:  Goal status: MET 03/25/24  4.  Pt will report she is able to perform stairs without difficulty and demonstrate a reciprocal gait with the rail.   Baseline:  Goal status: MET 04/08/24   5.  Pt will report she is able to ambulate extended community distance without increased pain.  Baseline:  Goal status: MET 03/25/24    PLAN:  PT FREQUENCY: 1-2x/wk  PT DURATION: 6 weeks  PLANNED INTERVENTIONS: 97164- PT Re-evaluation, 97750- Physical Performance Testing, 97110-Therapeutic exercises, 97530- Therapeutic activity, [id number]- Neuromuscular re-education, 97535- Self Care, 02859- Manual therapy, [phone]- Gait training, [phone]- Aquatic Therapy, [phone]- Electrical stimulation (unattended), [phone]- Electrical stimulation (manual), 97035- Ultrasound, Patient/Family education, Balance training, Stair training, Taping, Cryotherapy, and Moist heat  PLAN FOR NEXT SESSION:  DC next visit     Josette Rough, PT, DPT 04/15/24 10:23 AM

## 2024-04-16 ENCOUNTER — Encounter (HOSPITAL_BASED_OUTPATIENT_CLINIC_OR_DEPARTMENT_OTHER): Payer: Self-pay | Admitting: Physical Therapy
